# Patient Record
Sex: Female | Born: 1935 | Race: Black or African American | Hispanic: No | Smoking: Never smoker
Health system: Southern US, Academic
[De-identification: ages and names within clinical notes are randomized; demographics above are authoritative.]

## PROBLEM LIST (undated history)

## (undated) DIAGNOSIS — N19 Unspecified kidney failure: Secondary | ICD-10-CM

## (undated) DIAGNOSIS — I10 Essential (primary) hypertension: Secondary | ICD-10-CM

## (undated) DIAGNOSIS — E7800 Pure hypercholesterolemia, unspecified: Secondary | ICD-10-CM

## (undated) DIAGNOSIS — I509 Heart failure, unspecified: Secondary | ICD-10-CM

## (undated) DIAGNOSIS — M129 Arthropathy, unspecified: Secondary | ICD-10-CM

## (undated) DIAGNOSIS — J309 Allergic rhinitis, unspecified: Secondary | ICD-10-CM

## (undated) DIAGNOSIS — E119 Type 2 diabetes mellitus without complications: Secondary | ICD-10-CM

## (undated) HISTORY — DX: Arthropathy, unspecified: M12.9

## (undated) HISTORY — DX: Pure hypercholesterolemia, unspecified: E78.00

## (undated) HISTORY — PX: HX LAP CHOLECYSTECTOMY: SHX56

## (undated) HISTORY — PX: WRIST SURGERY: SHX841

## (undated) HISTORY — PX: LAMINECTOMY: SHX219

## (undated) HISTORY — PX: HX CATARACT REMOVAL: SHX102

## (undated) HISTORY — DX: Allergic rhinitis, unspecified: J30.9

## (undated) HISTORY — PX: HX HYSTERECTOMY: SHX81

## (undated) HISTORY — DX: Essential (primary) hypertension: I10

## (undated) HISTORY — DX: Type 2 diabetes mellitus without complications (CMS HCC): E11.9

## (undated) HISTORY — DX: Unspecified kidney failure: N19

## (undated) HISTORY — PX: HX ELBOW SURGERY: 2100001297

## (undated) HISTORY — PX: BREAST MASS EXCISION: SHX1267

## (undated) HISTORY — DX: Heart failure, unspecified (CMS HCC): I50.9

---

## 1988-07-06 ENCOUNTER — Other Ambulatory Visit (HOSPITAL_COMMUNITY): Payer: Self-pay

## 2014-09-30 IMAGING — US VENOUS US UNILATERAL
1 series · 14 of 24 positions shown · non-contrast
Comparison: None.

Exam: 
Left lower extremity Doppler venous ultrasound
INDICATION: Rule out DVT.

[Series 1: venous us unilateral · oblique · 14 of 59 slices shown]
[im 1/59]
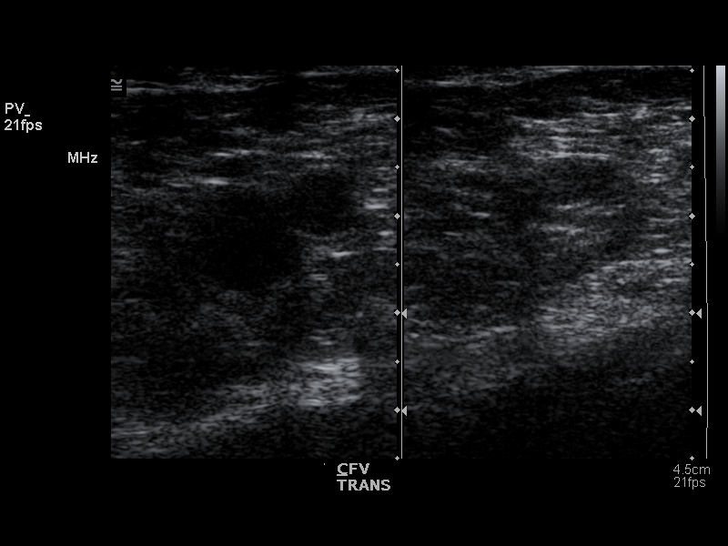
[im 6/59]
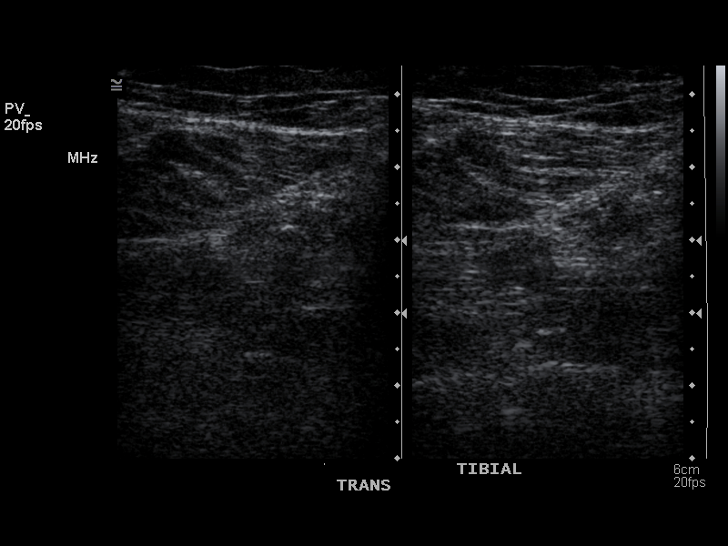
[im 11/59]
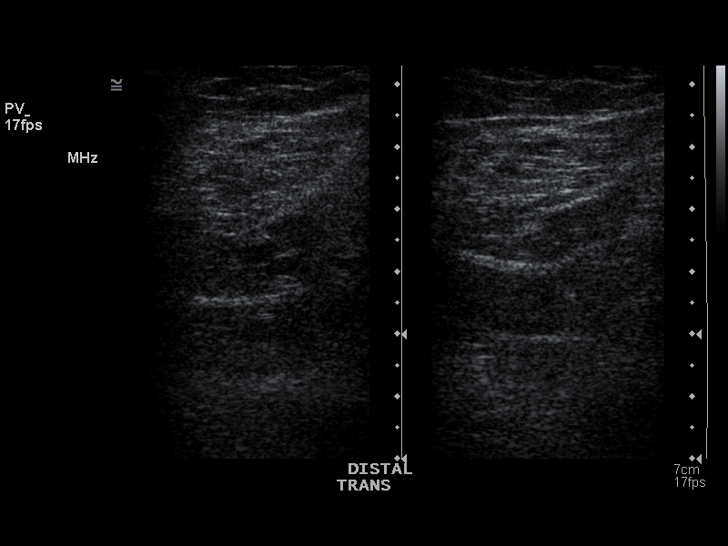
[im 16/59]
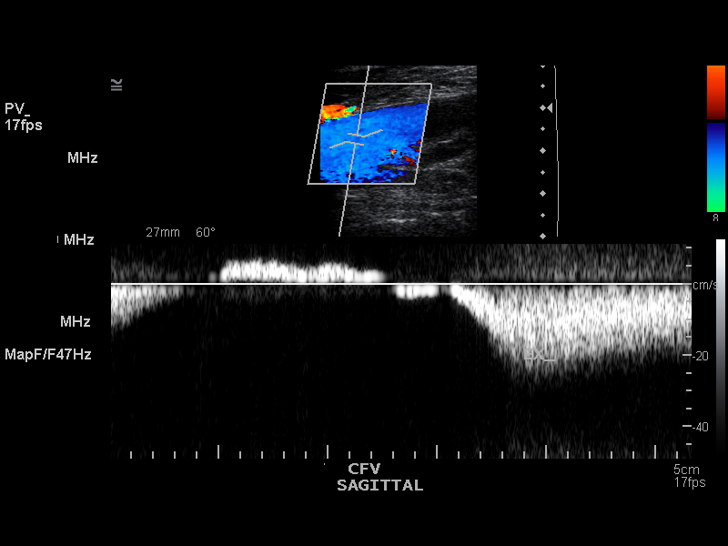
[im 18/59]
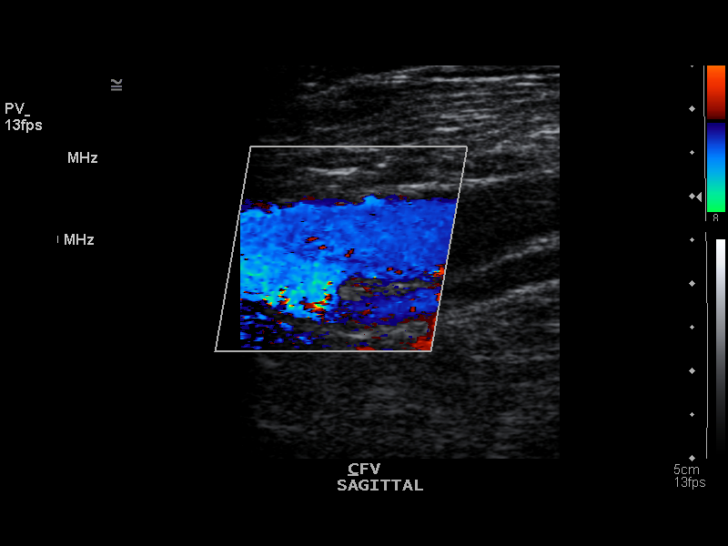
[im 23/59]
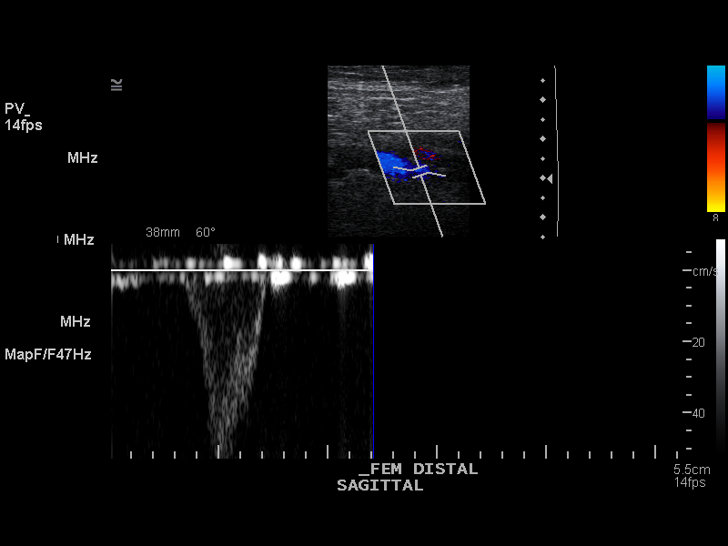
[im 28/59]
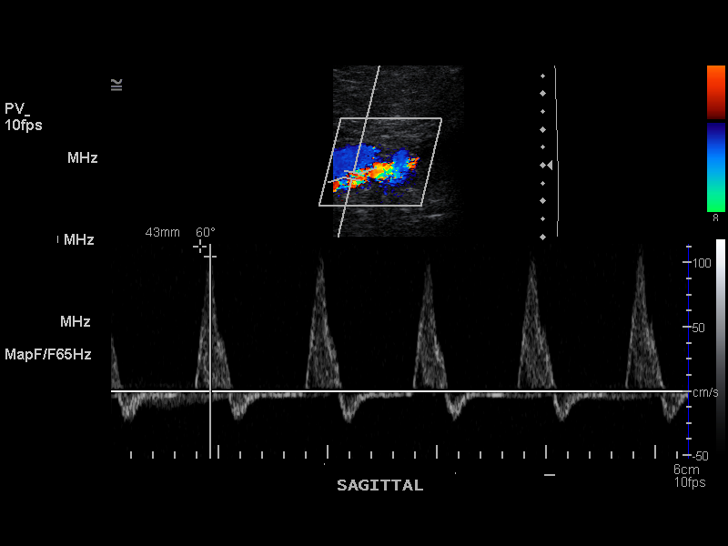
[im 31/59]
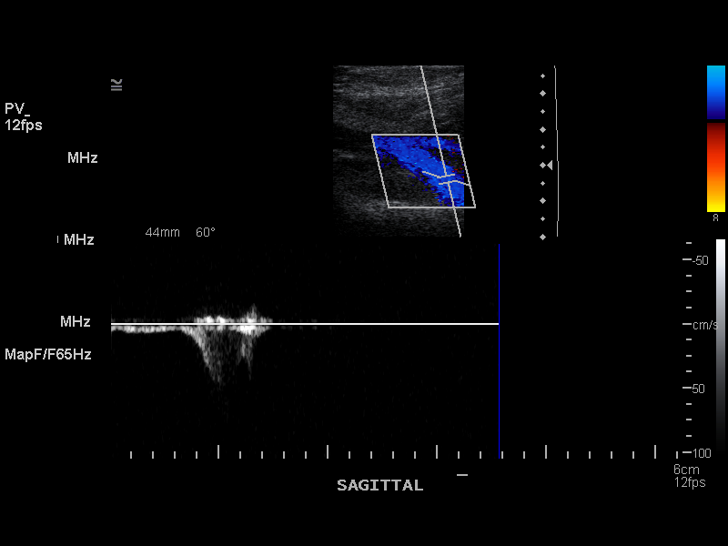
[im 36/59]
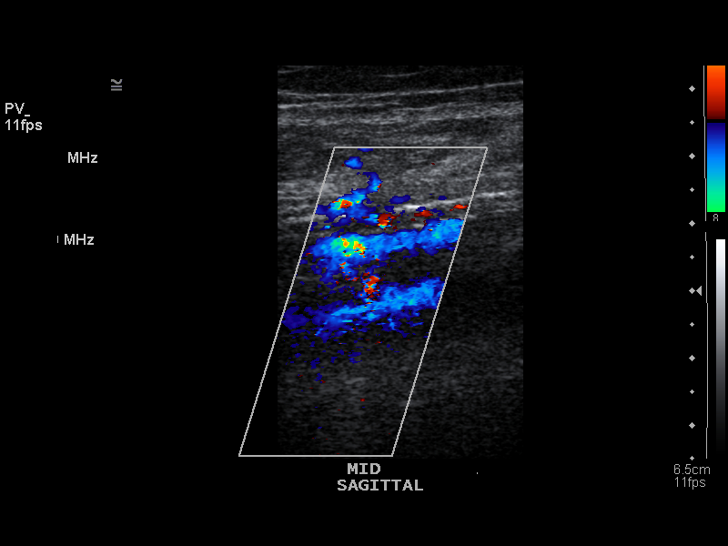
[im 41/59]
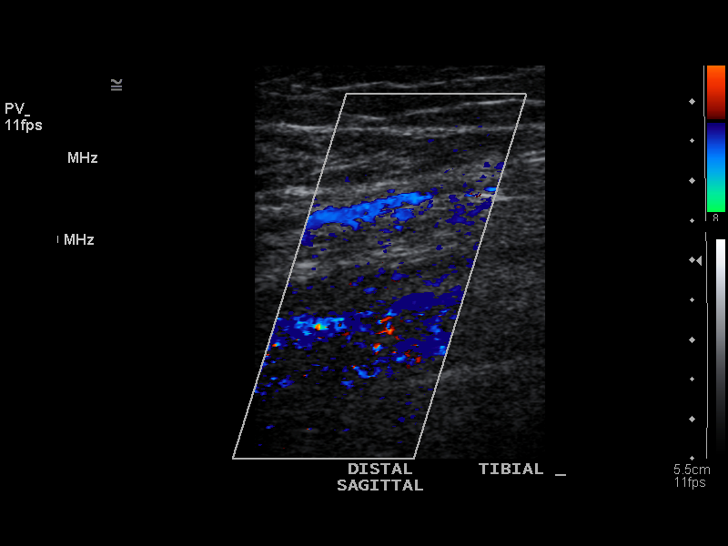
[im 46/59]
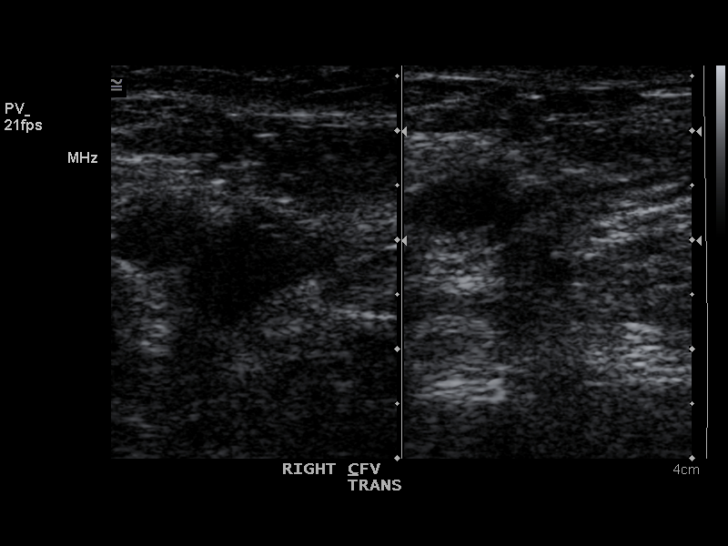
[im 48/59]
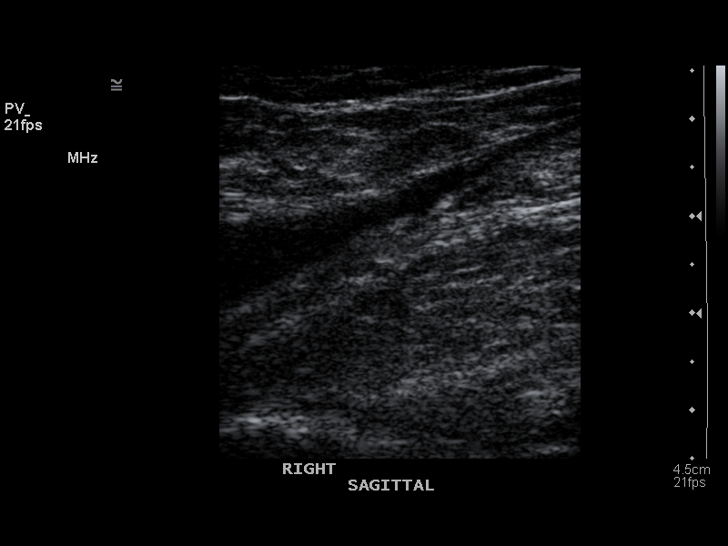
[im 53/59]
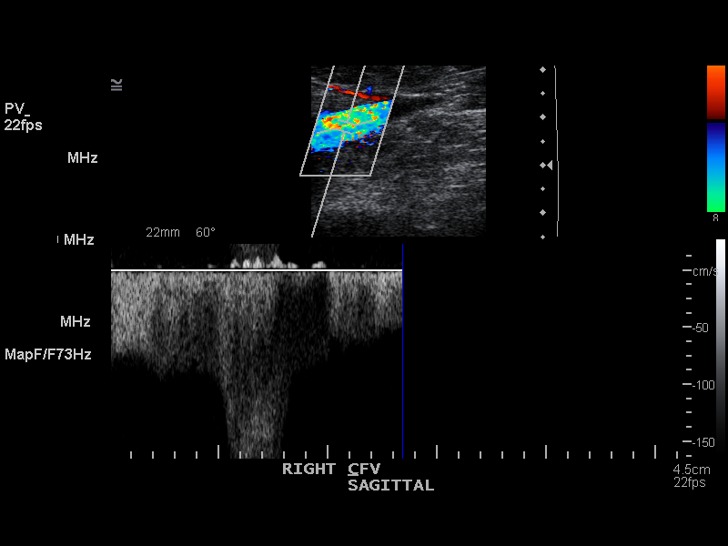
[im 59/59]
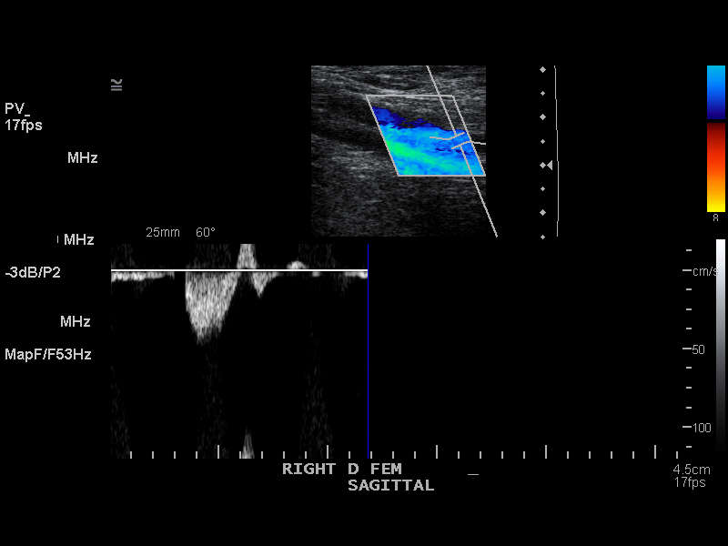

[14 of 24 positions shown; findings below may reference images not displayed]

FINDINGS: The common femoral, superficial femoral, popliteal veins of the left lower extremity demonstrate compressibility, color flow, and augmentation. The contra lateral common femoral vein demonstrates compressibility as well.
IMPRESSION: 1.
Negative left lower extremity Doppler venous ultrasound.

## 2020-06-30 IMAGING — MR MRI BRAIN W/O CONTRAST
8 of 9 series · 36 of 48 positions shown · IV contrast (gadolinium)
Comparison: None available.

﻿EXAM:  MRI BRAIN W/O CONTRAST
INDICATION: Dementia.
TECHNIQUE: Multiplanar, multisequential MRI of the brain was performed without gadolinium contrast.

[Series 5: DWI · axial · 5.0mm · 1.35mm/px · z∈[-53,+67]mm · 10 of 88 slices shown (1 of 3)]
[im 6/88]
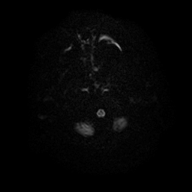
[im 12/88]
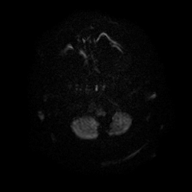
[im 18/88]
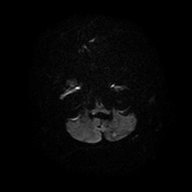
[im 30/88]
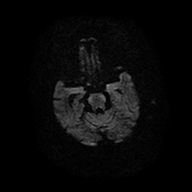
[im 41/88]
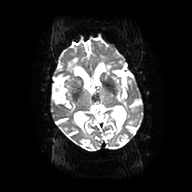
[im 47/88]
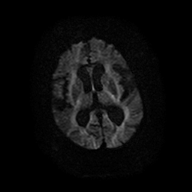
[im 53/88]
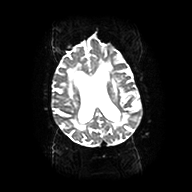
[im 64/88]
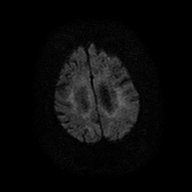
[im 76/88]
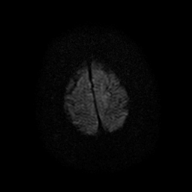
[im 88/88]
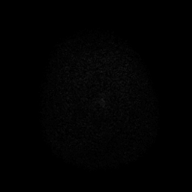

[Series 6: DWI · axial · 5.0mm · 1.35mm/px · z∈[-59,+67]mm · 4 of 22 slices shown (2 of 3)]
[im 1/22]
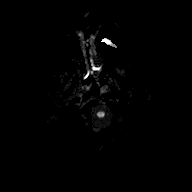
[im 8/22]
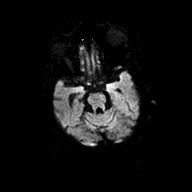
[im 15/22]
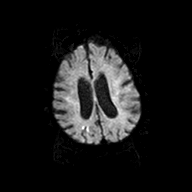
[im 22/22]
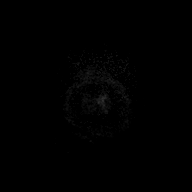

[Series 7: DWI · axial · 5.0mm · 1.35mm/px · z∈[-59,+67]mm · 4 of 22 slices shown (3 of 3)]
[im 1/22]
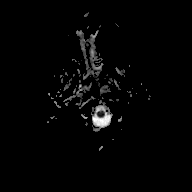
[im 8/22]
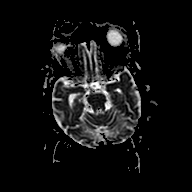
[im 15/22]
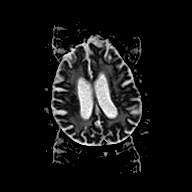
[im 22/22]
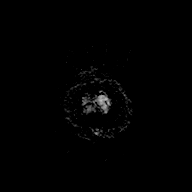

[Series 8: FLAIR · sagittal · 4.0mm · 0.75mm/px · 4 of 26 slices shown (1 of 2)]
[im 1/26]
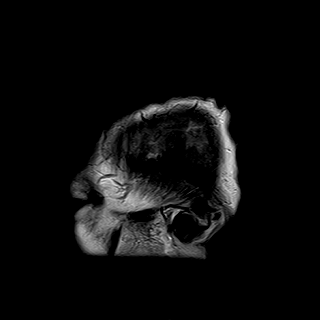
[im 9/26]
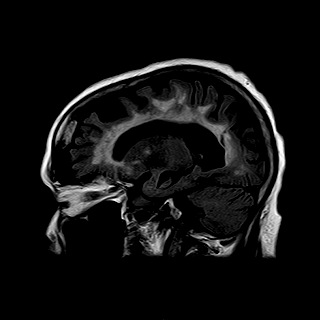
[im 17/26]
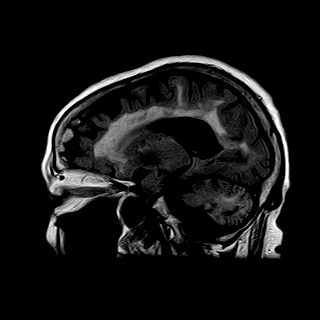
[im 26/26]
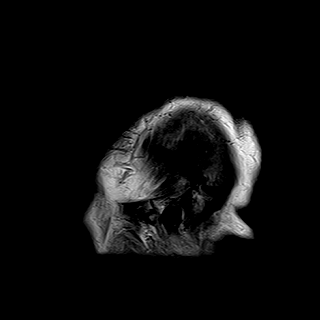

[Series 9: FLAIR · axial · 5.0mm · 0.43mm/px · z∈[-63,+81]mm · 4 of 25 slices shown (2 of 2)]
[im 1/25]
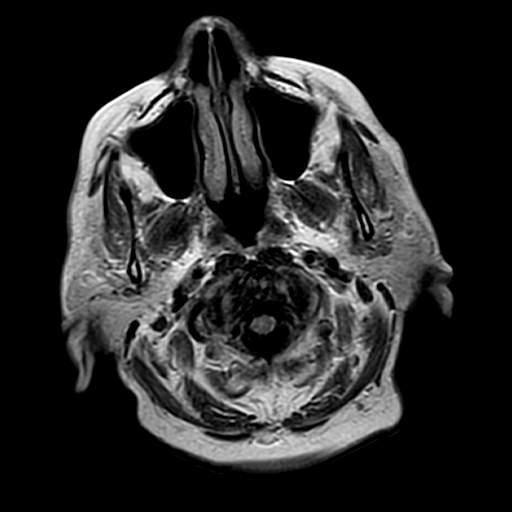
[im 9/25]
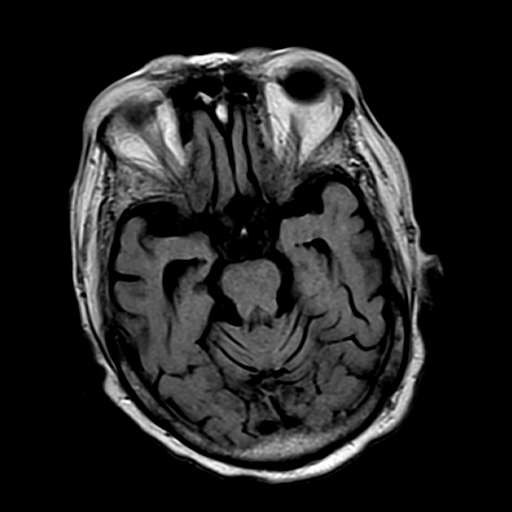
[im 17/25]
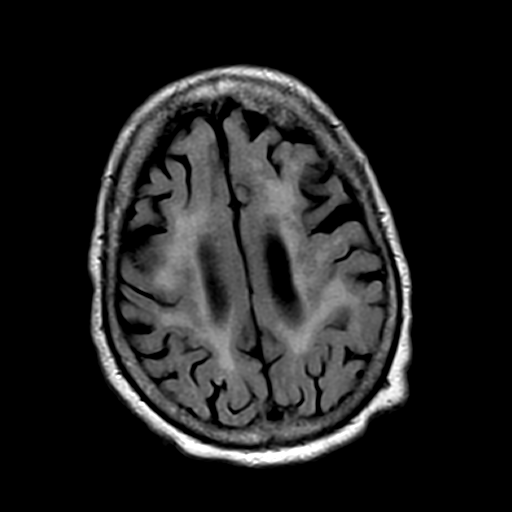
[im 25/25]
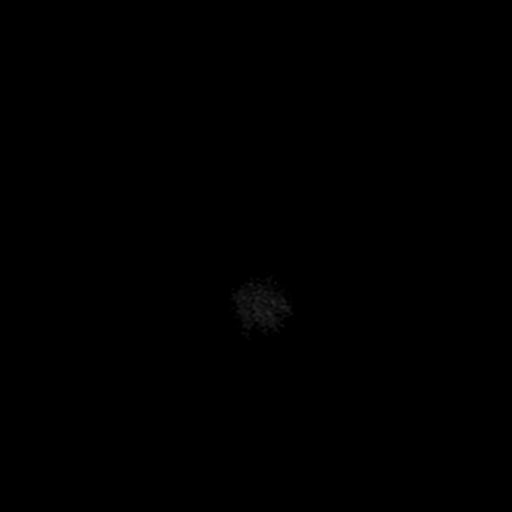

[Series 10: T2 · axial · 5.0mm · 0.43mm/px · z∈[-63,+81]mm · 4 of 25 slices shown (1 of 2)]
[im 1/25]
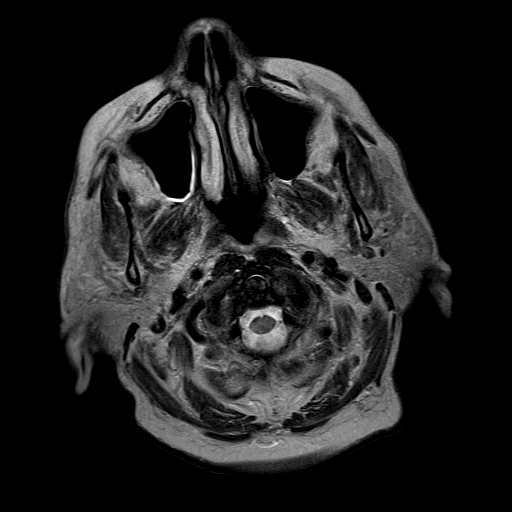
[im 9/25]
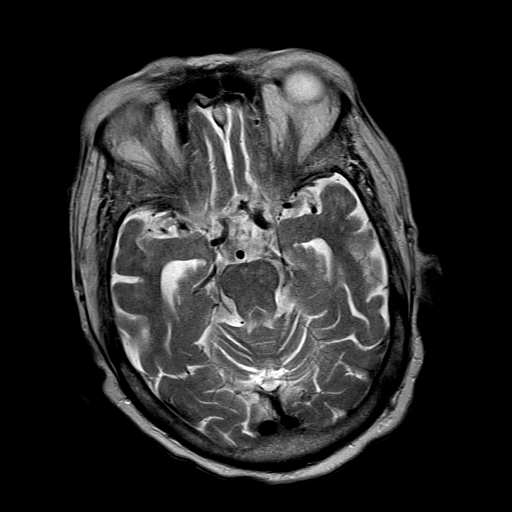
[im 17/25]
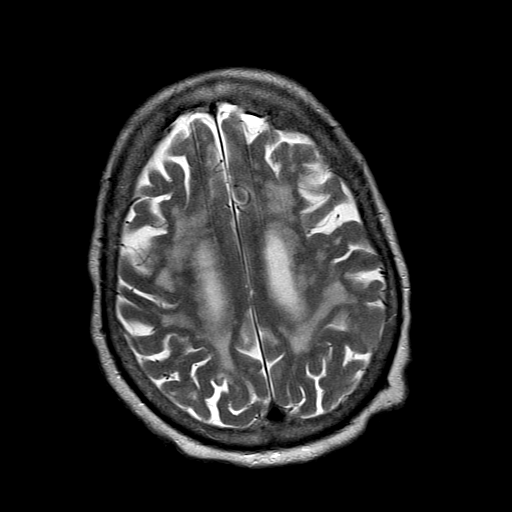
[im 25/25]
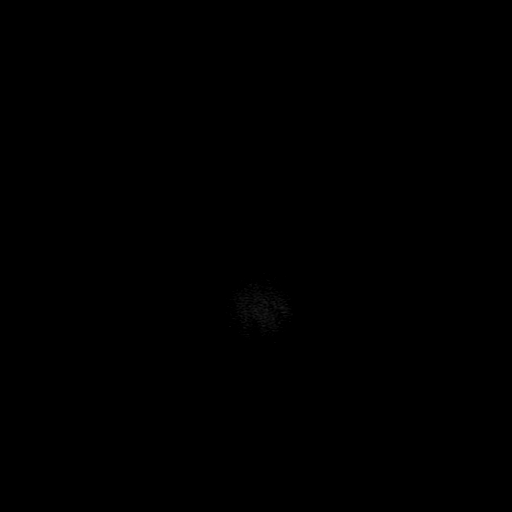

[Series 11: T2 · coronal · 6.0mm · 0.43mm/px · 4 of 24 slices shown (2 of 2)]
[im 1/24]
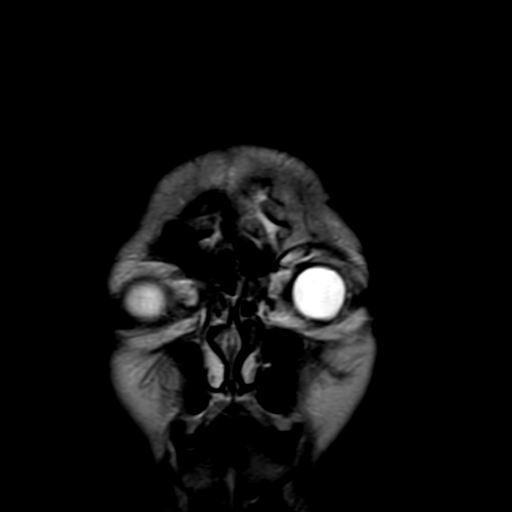
[im 8/24]
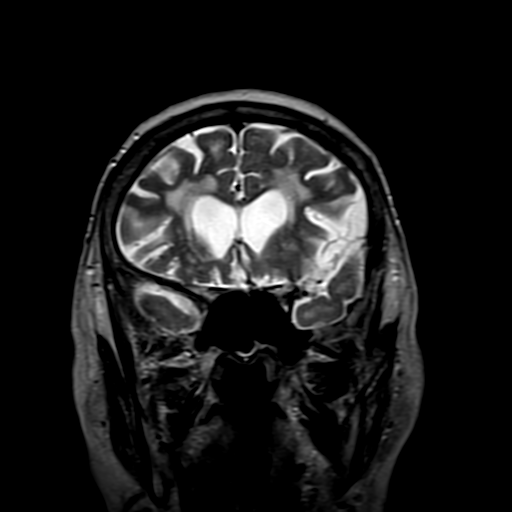
[im 16/24]
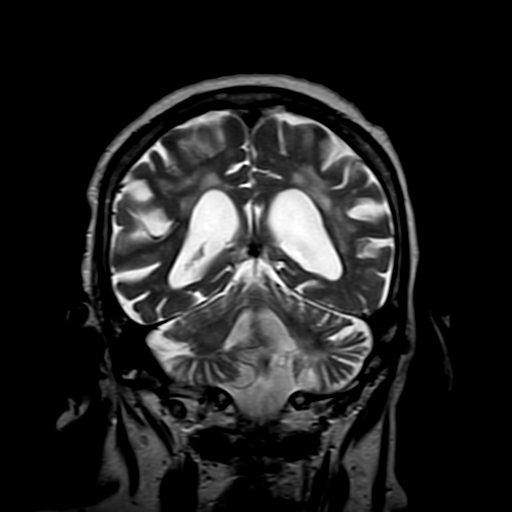
[im 24/24]
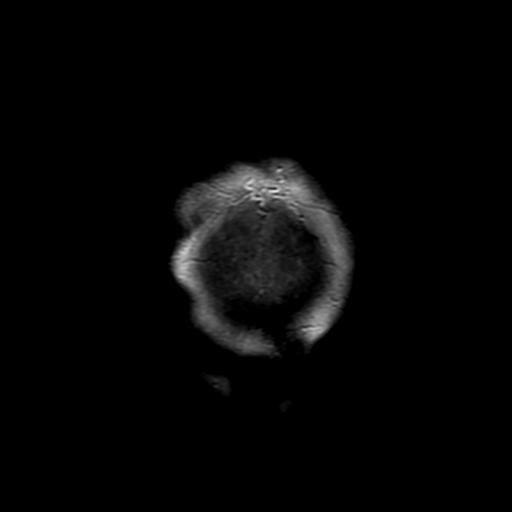

[Series 13: T1 · axial · 5.0mm · 0.43mm/px · z∈[-63,-15]mm · 2 of 25 slices shown]
[im 1/25]
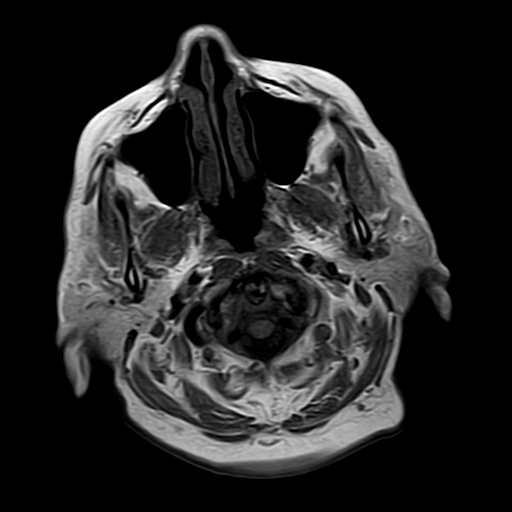
[im 9/25]
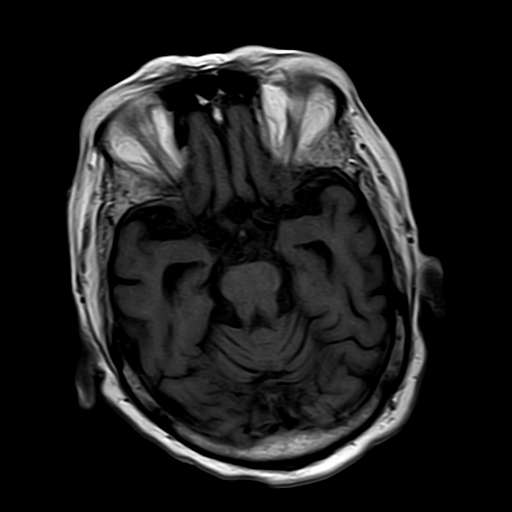

[36 of 48 positions shown; findings below may reference images not displayed]

FINDINGS: There is moderate cortical atrophy and severe chronic small vessel ischemic changes. There is no evidence of normal pressure hydrocephalus. Left inferior cerebellar area of encephalomalacia is also identified. There is no mass effect, midline shift or intracranial hemorrhage. There is no evidence of acute infarction or prior microhemorrhages. Skull base flow voids and basal cisterns are patent. Sagittal survey of midline structures is unremarkable. There are no extra-axial fluid collections. There is partial opacification of the mastoid air cells bilaterally. Partial opacification of right ethmoid air cells is also noted. Orbital contents appear unremarkable.
IMPRESSION: Moderate cortical atrophy and severe chronic small vessel ischemic changes. No acute intracranial abnormality.

## 2020-07-09 IMAGING — MR MRI CERVICAL SPINE WITHOUT CONTRAST
4 of 5 series · 23 of 48 positions shown · IV contrast (gadolinium)
Comparison: None available.

﻿EXAM:  [id number]   MRI CERVICAL SPINE WITHOUT CONTRAST
INDICATION: Bilateral lower extremity paralysis.
TECHNIQUE: Multiplanar, multisequential MRI of the cervical spine was performed without gadolinium contrast.

[Series 5: T2 · sagittal · 3.0mm · 0.78mm/px · 7 of 11 slices shown (1 of 2)]
[im 1/11]
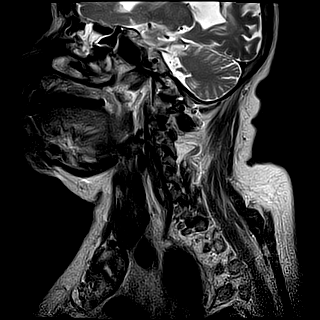
[im 2/11]
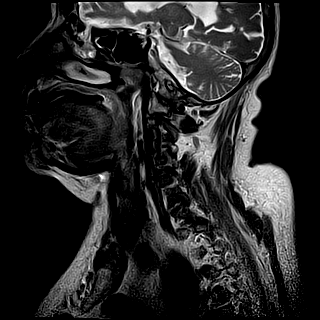
[im 4/11]
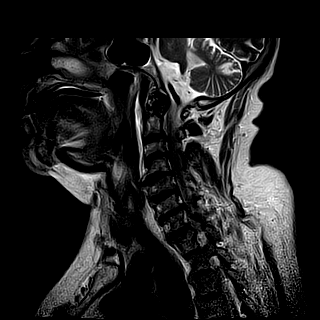
[im 6/11]
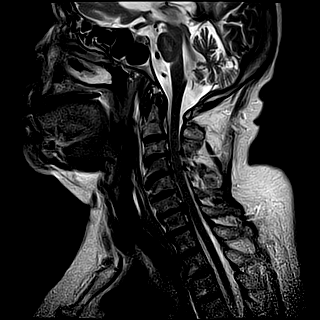
[im 7/11]
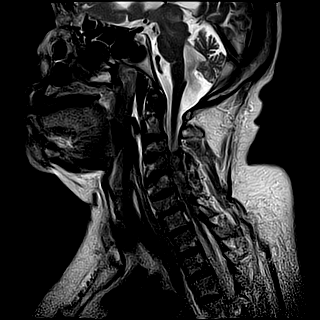
[im 9/11]
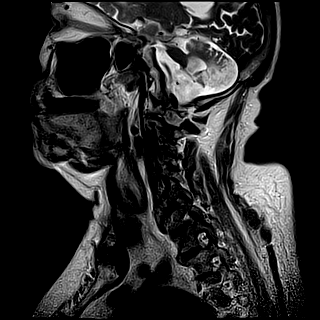
[im 11/11]
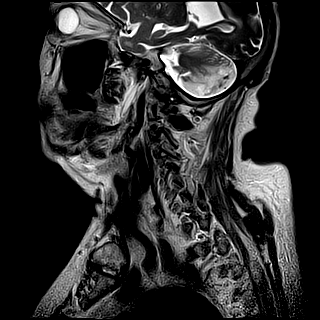

[Series 6: T1 · sagittal · 3.0mm · 0.49mm/px · 4 of 11 slices shown]
[im 1/11]
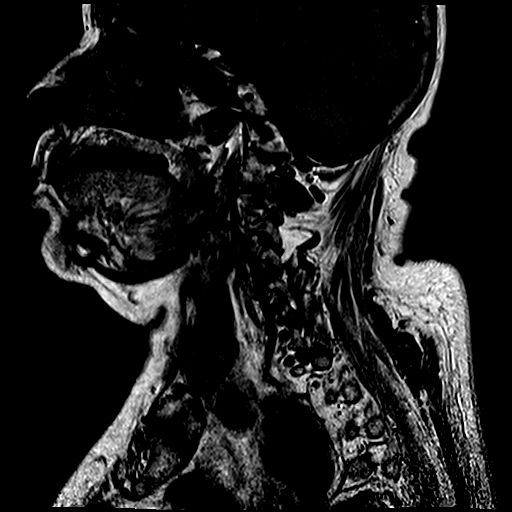
[im 2/11]
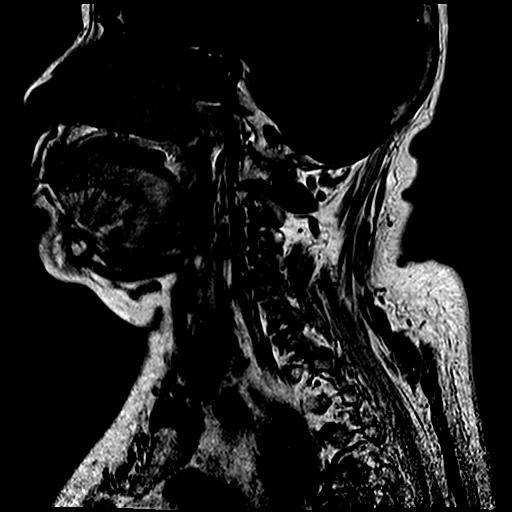
[im 6/11]
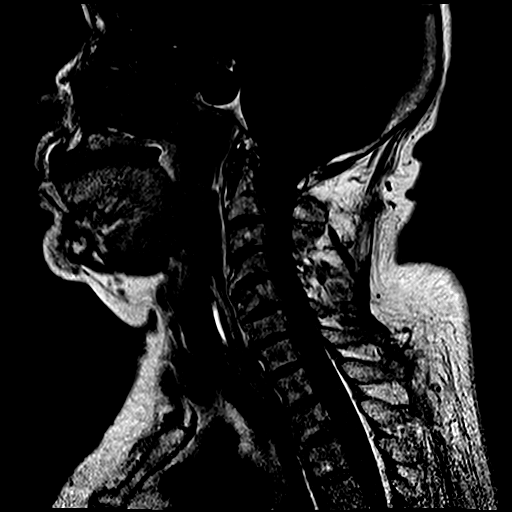
[im 9/11]
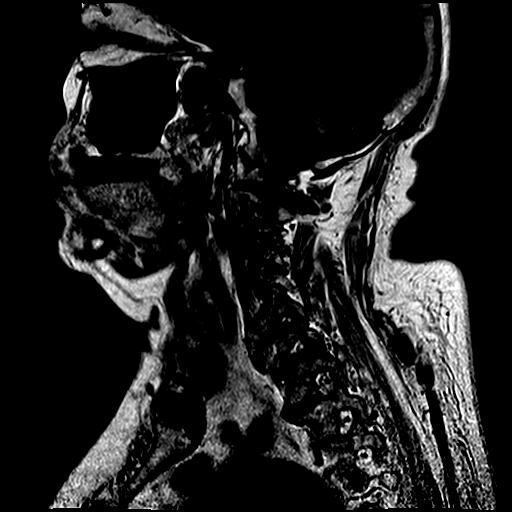

[Series 7: STIR · sagittal · 3.0mm · 0.49mm/px · 3 of 11 slices shown]
[im 2/11]
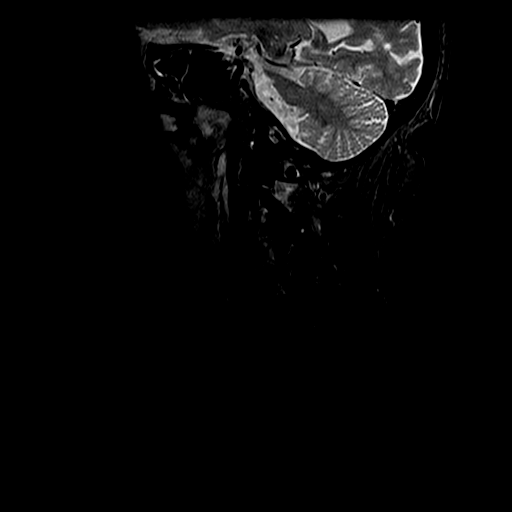
[im 6/11]
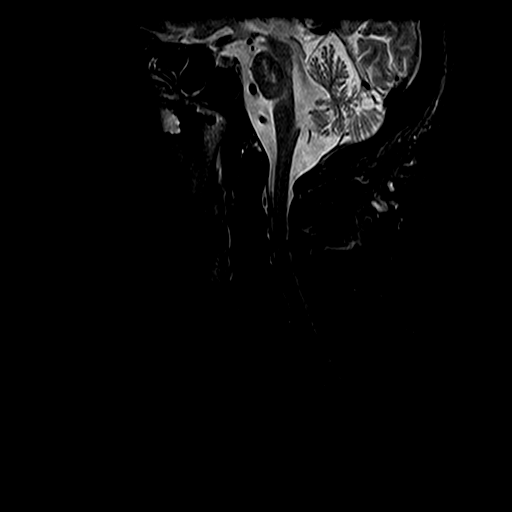
[im 9/11]
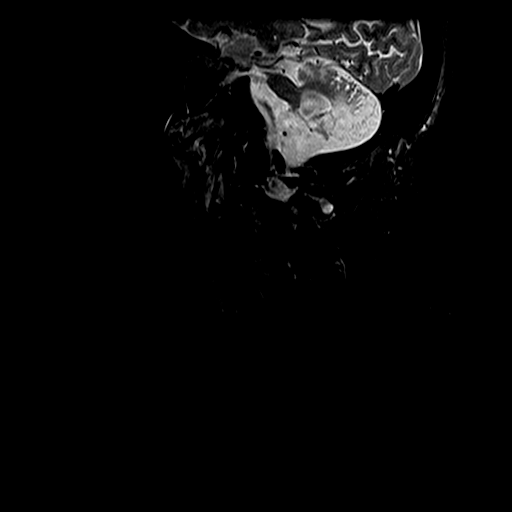

[Series 9: T2 · axial · 3.5mm · 0.31mm/px · z∈[-83,+5]mm · 9 of 18 slices shown (2 of 2)]
[im 1/18]
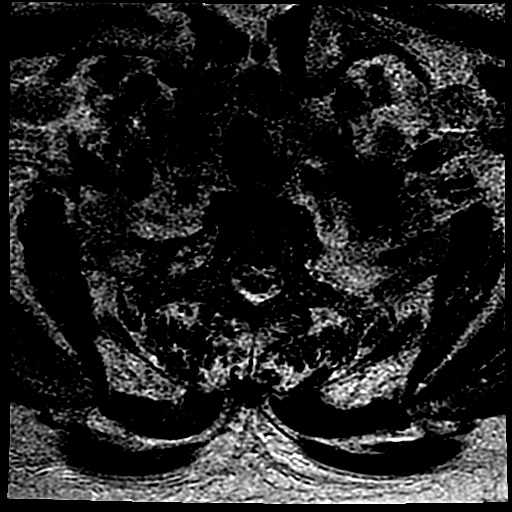
[im 3/18]
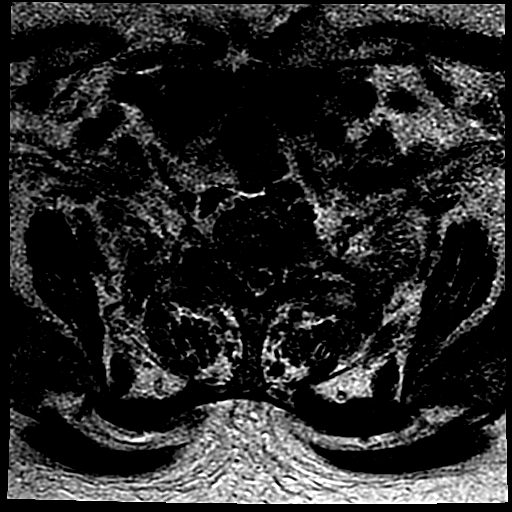
[im 6/18]
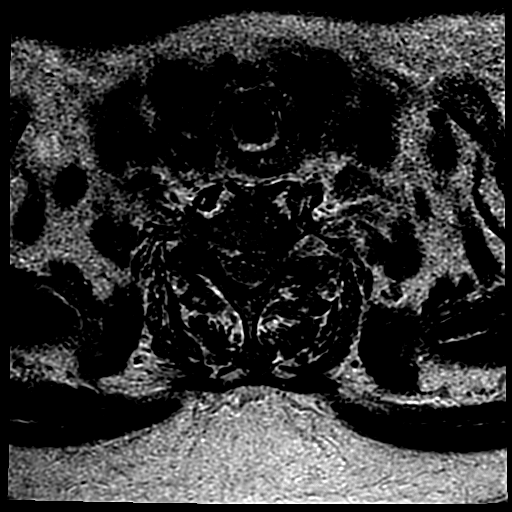
[im 8/18]
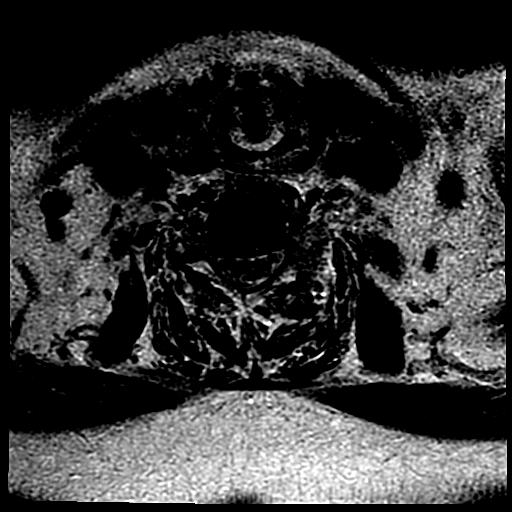
[im 9/18]
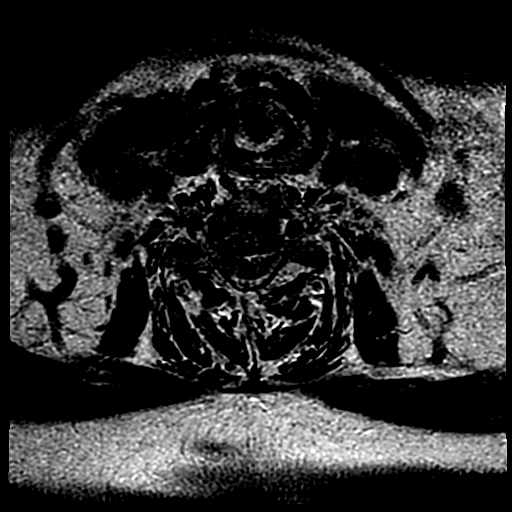
[im 10/18]
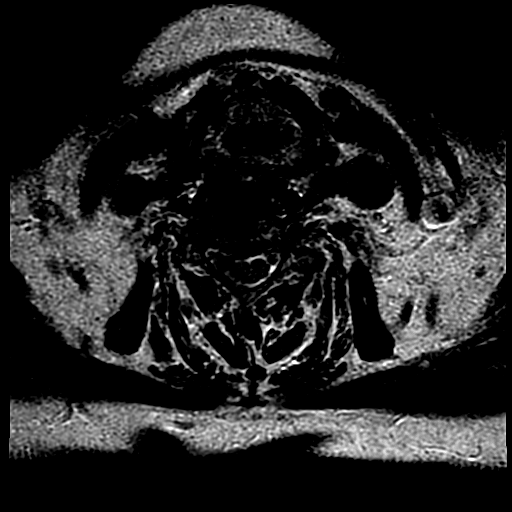
[im 12/18]
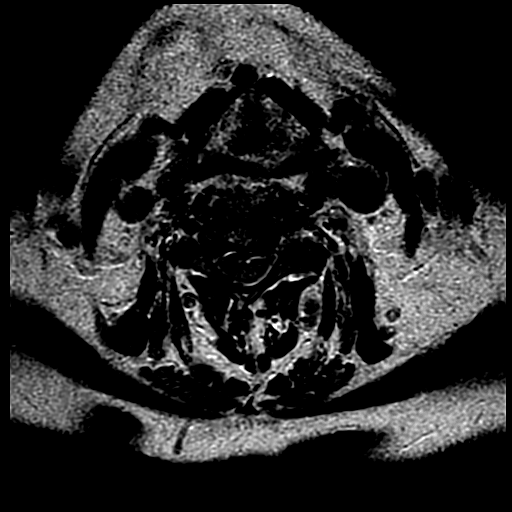
[im 15/18]
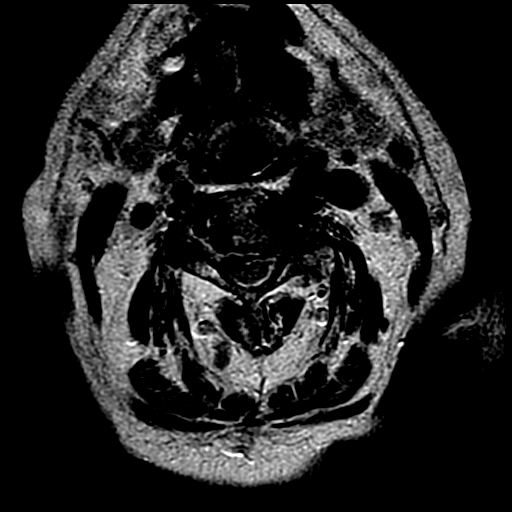
[im 18/18]
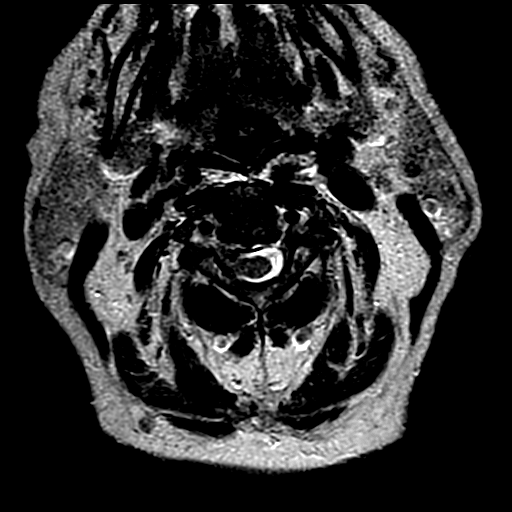

[23 of 48 positions shown; findings below may reference images not displayed]

FINDINGS: Vertebral bodies are normal in height, alignment and signal intensity.  There is no acute fracture or subluxation.  

At C2-3 level, there is moderate left neural foraminal stenosis from facet and uncovertebral joint hypertrophy.  

At C3-4 level, there is a small broad-based central disc osteophyte complex resulting in mild spinal stenosis.  There is severe left and mild right neural foraminal stenosis from facet and uncovertebral joint hypertrophy.  

At C4-5 level, there is a small broad-based central disc osteophyte complex with near complete effacement of the ventral CSF.  There is severe right neural foraminal stenosis from facet and uncovertebral joint hypertrophy.  

At C5-6 level, there is a small broad-based central disc osteophyte complex with near complete effacement of the ventral CSF.  There is mild right neural foraminal stenosis from facet and uncovertebral joint hypertrophy.

At C6-7 level, there is a small broad-based central disc osteophyte complex with near complete effacement of the ventral CSF.  There is severe right neural foraminal stenosis from uncovertebral joint hypertrophy.  

At C7-T1 level, there is minimal anterolisthesis of C7 on T1 vertebral body.  There is a small broad-based central disc osteophyte complex partially effacing the ventral CSF.  There is moderate left neural foraminal stenosis from facet arthropathy.  

Paraspinal soft tissues are unremarkable.
IMPRESSION: 1. Minimal anterolisthesis of C7 on T1 vertebral body.  

2. Disc osteophyte complexes at most levels with mild spinal stenosis at C3-4 level.  

3. Multi-level neural foraminal stenosis as detailed above.

## 2020-07-09 IMAGING — MR MRI THORACIC SPINE WITHOUT CONTRAST
4 of 6 series · 30 of 48 positions shown · IV contrast (gadolinium)
Comparison: None available.

﻿EXAM:  [id number]   MRI THORACIC SPINE WITHOUT CONTRAST
INDICATION: Lower extremity paralysis.
TECHNIQUE: Multiplanar, multisequential MRI of the thoracic spine was performed without gadolinium contrast.

[Series 5: T2 · sagittal · 4.0mm · 0.78mm/px · 7 of 13 slices shown (1 of 3)]
[im 1/13]
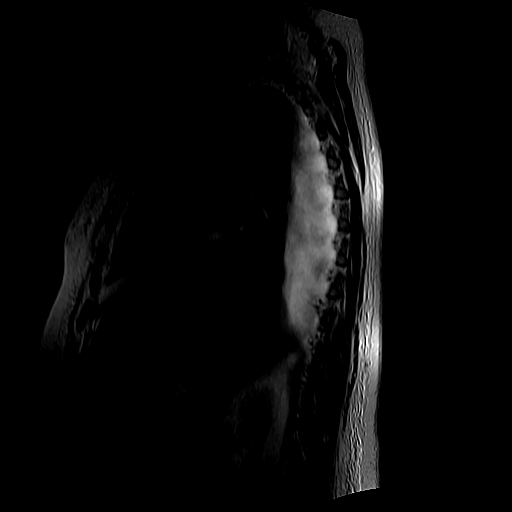
[im 3/13]
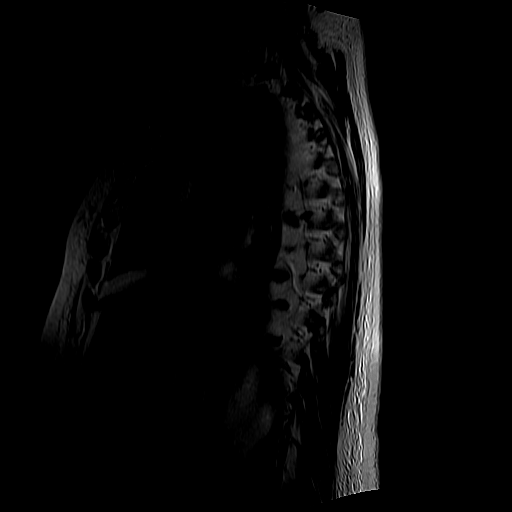
[im 5/13]
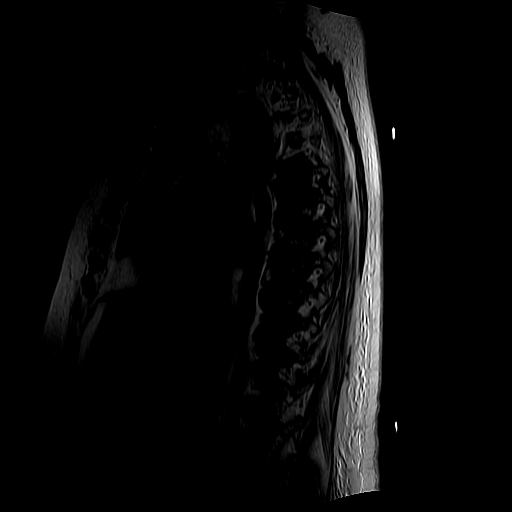
[im 7/13]
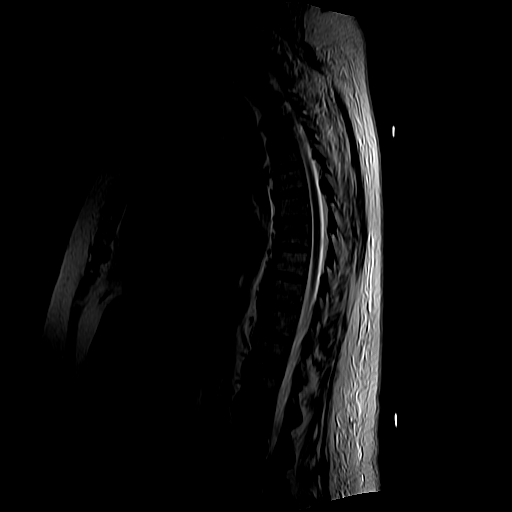
[im 9/13]
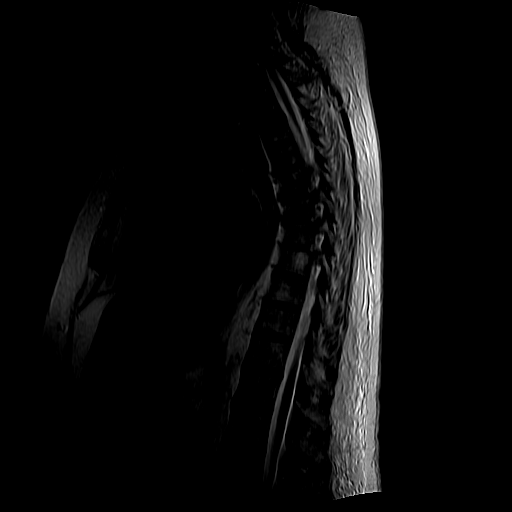
[im 11/13]
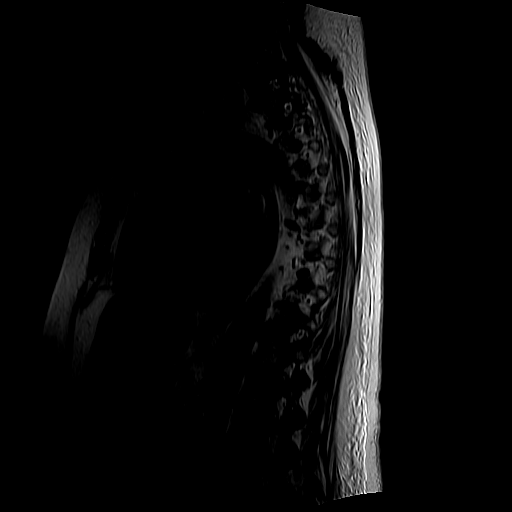
[im 13/13]
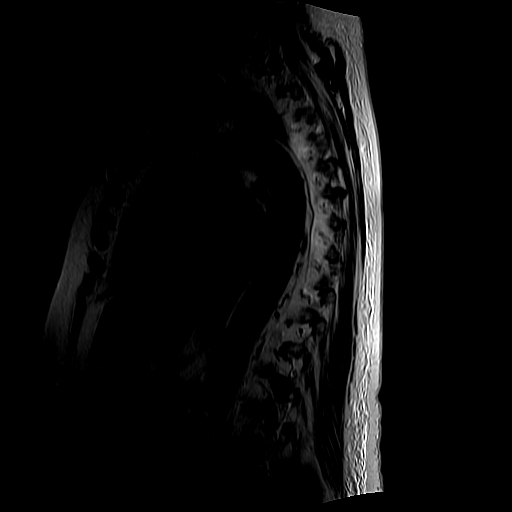

[Series 6: T1 · sagittal · 4.0mm · 0.78mm/px · 5 of 13 slices shown]
[im 1/13]
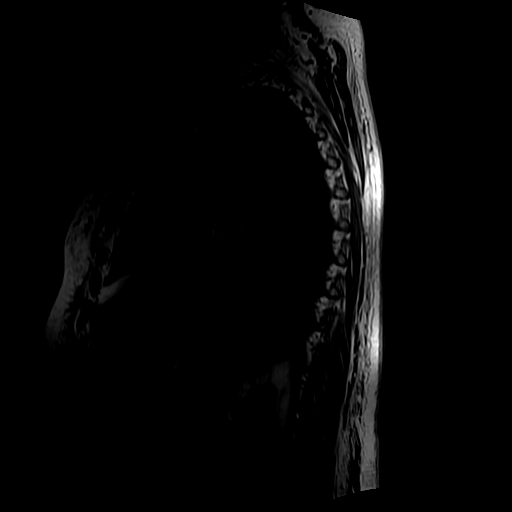
[im 2/13]
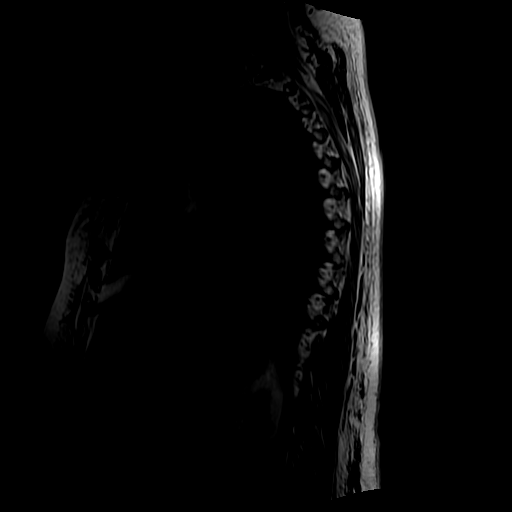
[im 4/13]
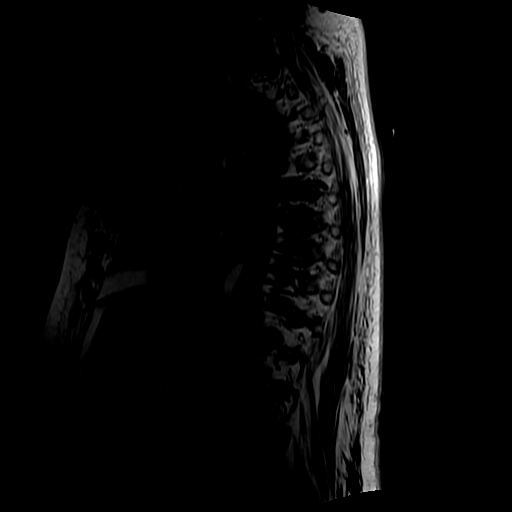
[im 7/13]
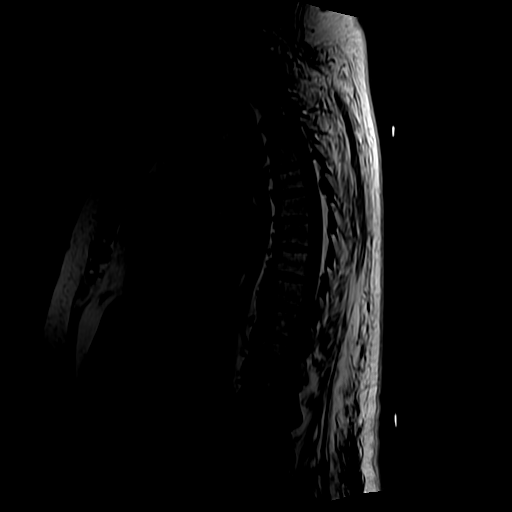
[im 11/13]
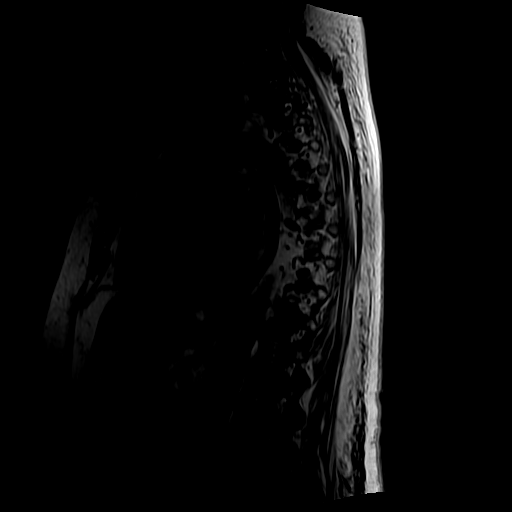

[Series 9: T2 · axial · 4.0mm · 0.62mm/px · z∈[-306,-99]mm · 7 of 12 slices shown (2 of 3)]
[im 1/12]
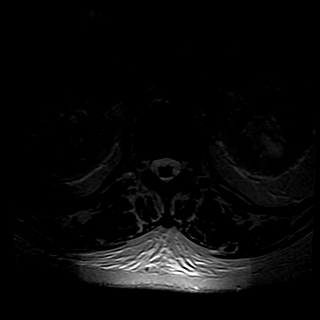
[im 2/12]
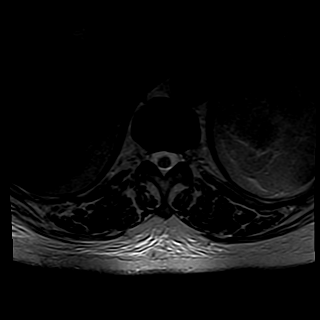
[im 4/12]
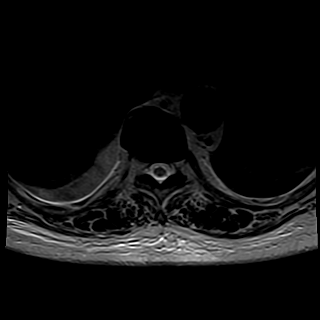
[im 6/12]
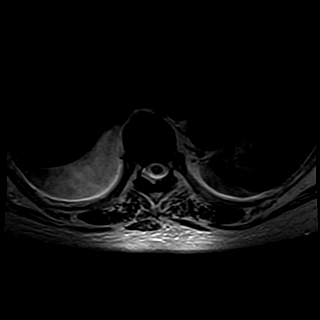
[im 8/12]
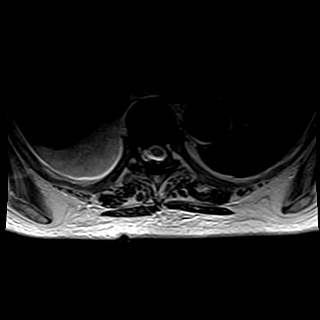
[im 10/12]
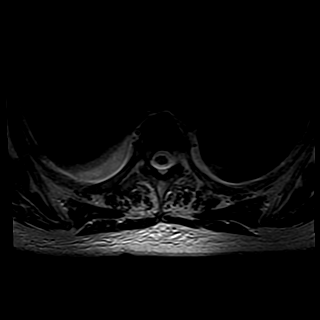
[im 12/12]
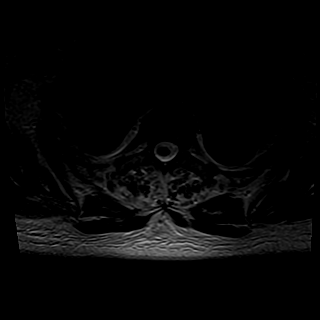

[Series 11: T2 · axial · 18.0mm · 0.69mm/px · z∈[-357,-34]mm · 11 of 18 slices shown (3 of 3)]
[im 1/18]
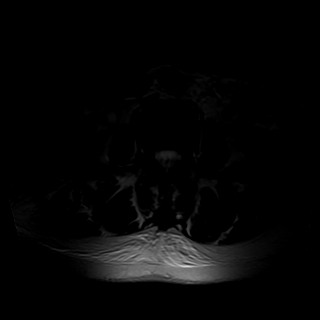
[im 2/18]
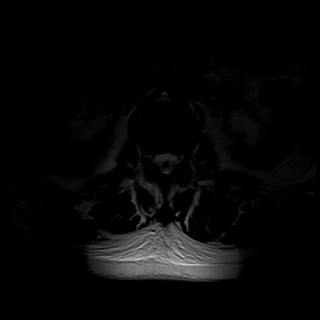
[im 4/18]
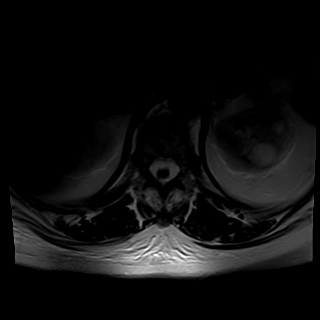
[im 6/18]
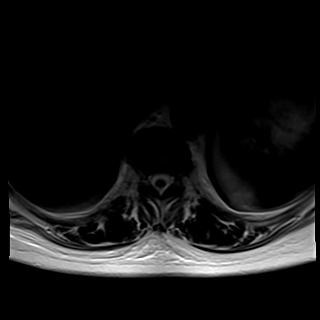
[im 7/18]
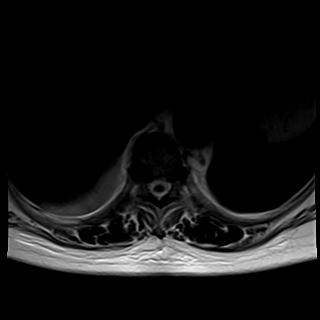
[im 9/18]
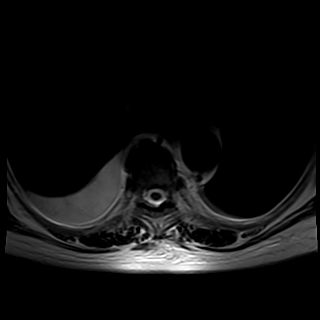
[im 11/18]
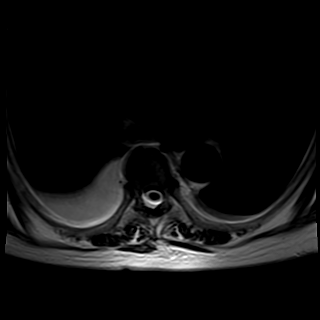
[im 12/18]
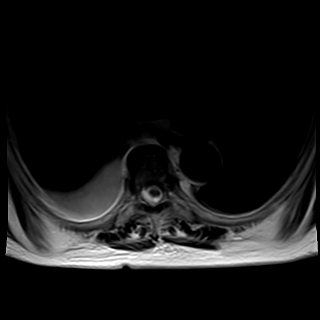
[im 14/18]
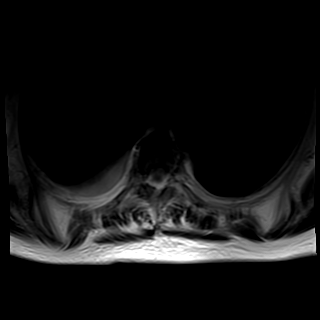
[im 16/18]
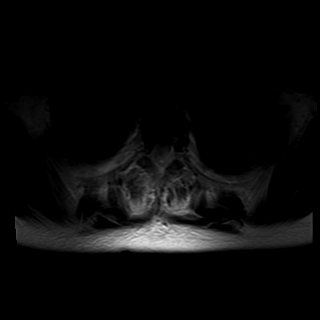
[im 18/18]
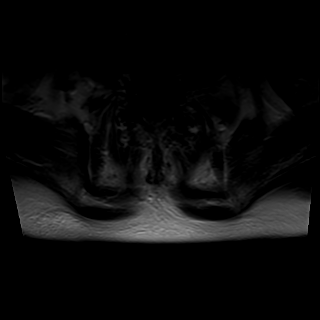

[30 of 48 positions shown; findings below may reference images not displayed]

FINDINGS: Vertebral bodies are normal in height, alignment and signal intensity.  There is no acute fracture or subluxation.  Visualized spinal cord is also normal in signal intensity without evidence of compression at any level.  

There is no significant disc herniation, spinal canal or neural foraminal stenosis at any level.  

Paraspinal soft tissues are also unremarkable.  There is a small right pleural effusion.
IMPRESSION: 1. Unremarkable thoracic spine.  

2. Small right pleural effusion.

## 2020-07-09 IMAGING — MR MRI LUMBAR SPINE WITHOUT CONTRAST
6 series · 43 of 48 positions shown · IV contrast (gadolinium)
Comparison: None available.

﻿EXAM:  [id number]   MRI LUMBAR SPINE WITHOUT CONTRAST
INDICATION: Bilateral lower extremity paralysis.
TECHNIQUE: Multiplanar, multisequential MRI of the lumbar spine was performed without gadolinium contrast.

[Series 8: T2 · sagittal · 4.0mm · 0.94mm/px · 5 of 13 slices shown (1 of 3)]
[im 1/13]
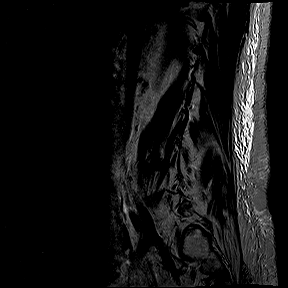
[im 4/13]
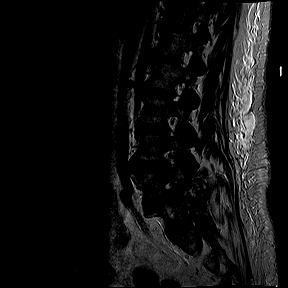
[im 7/13]
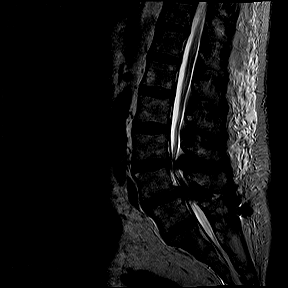
[im 10/13]
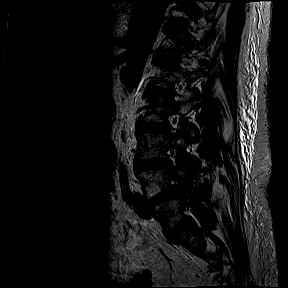
[im 13/13]
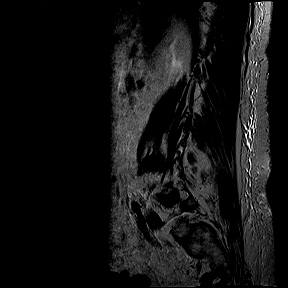

[Series 10: T1 · sagittal · 4.0mm · 0.94mm/px · 6 of 13 slices shown (1 of 2)]
[im 1/13]
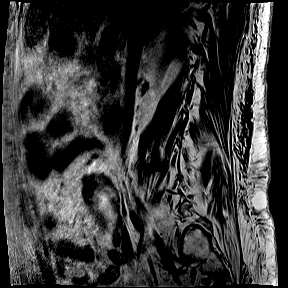
[im 3/13]
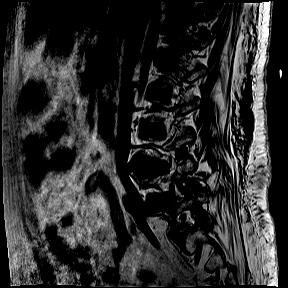
[im 5/13]
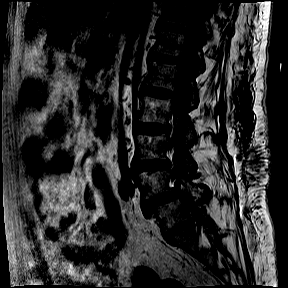
[im 8/13]
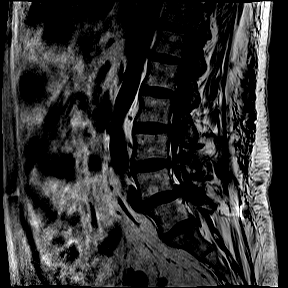
[im 10/13]
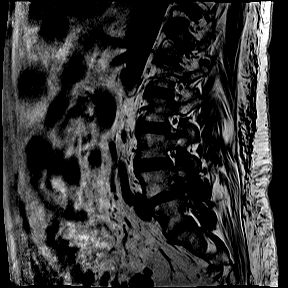
[im 13/13]
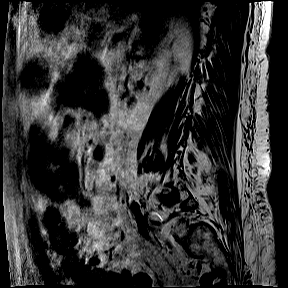

[Series 11: STIR · sagittal · 4.0mm · 1.05mm/px · 4 of 13 slices shown]
[im 1/13]
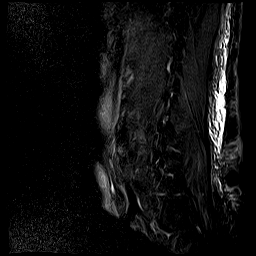
[im 3/13]
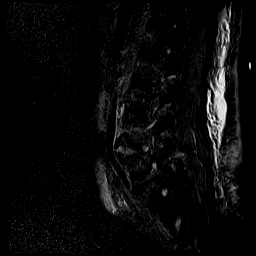
[im 5/13]
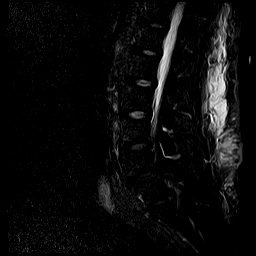
[im 8/13]
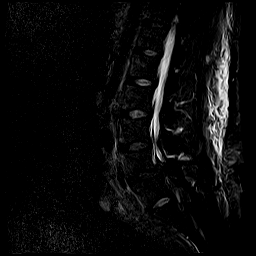

[Series 12: T2 · axial · 4.0mm · 0.47mm/px · z∈[-119,+57]mm · 11 of 23 slices shown (2 of 3)]
[im 1/23]
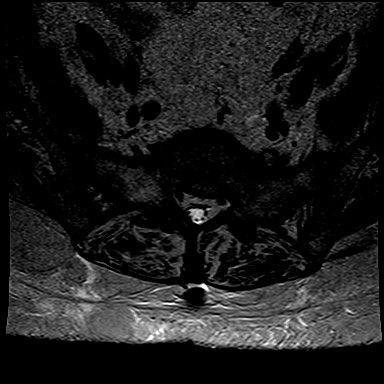
[im 3/23]
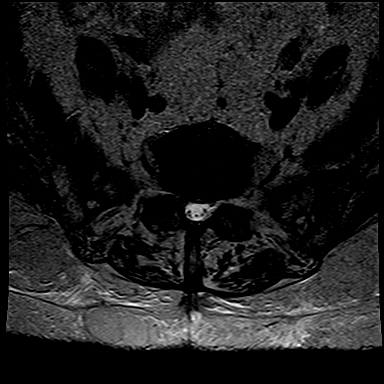
[im 5/23]
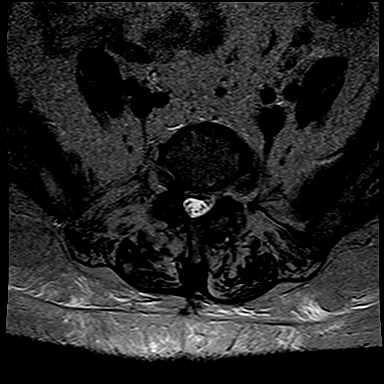
[im 7/23]
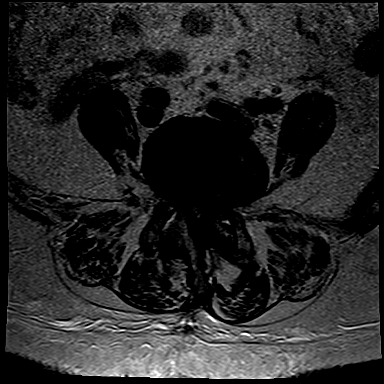
[im 9/23]
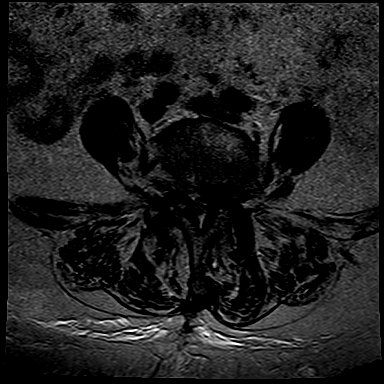
[im 12/23]
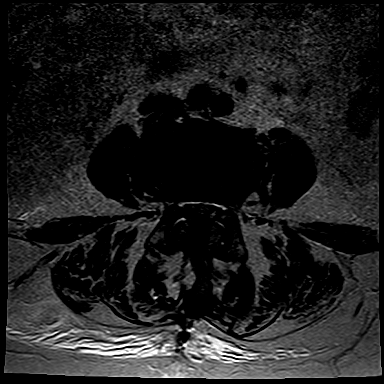
[im 14/23]
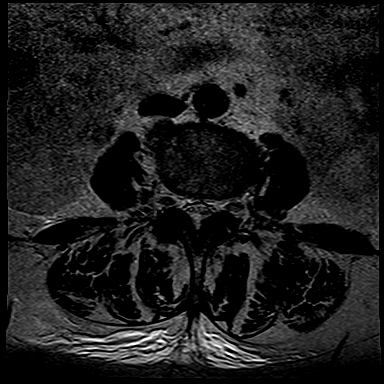
[im 16/23]
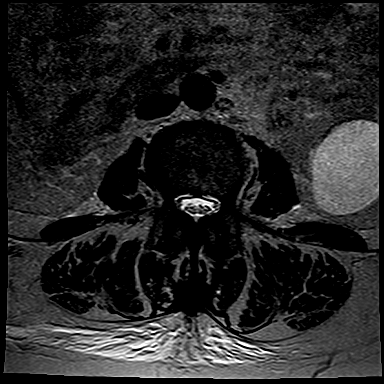
[im 18/23]
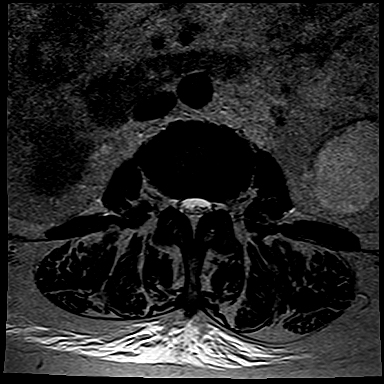
[im 20/23]
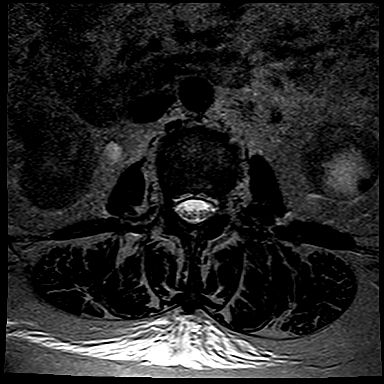
[im 23/23]
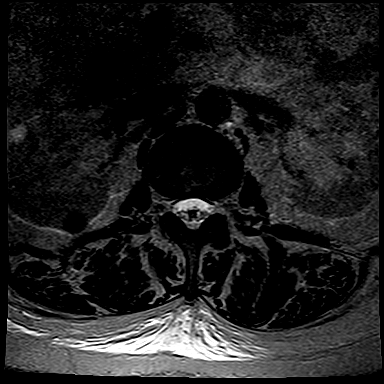

[Series 13: T1 · axial · 4.0mm · 0.47mm/px · z∈[-119,+57]mm · 8 of 23 slices shown (2 of 2)]
[im 1/23]
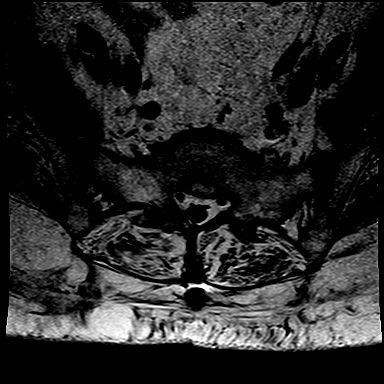
[im 5/23]
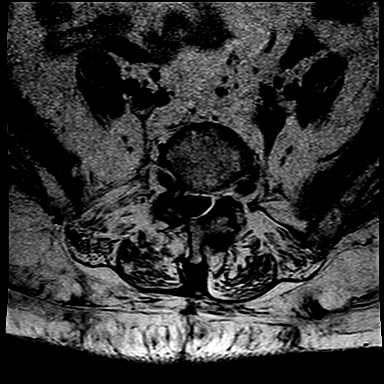
[im 7/23]
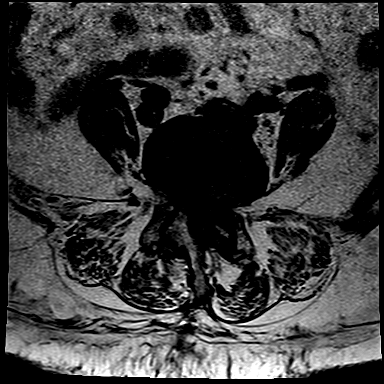
[im 9/23]
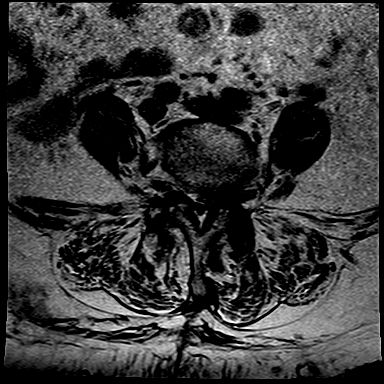
[im 14/23]
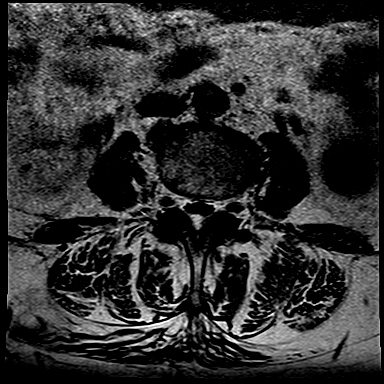
[im 16/23]
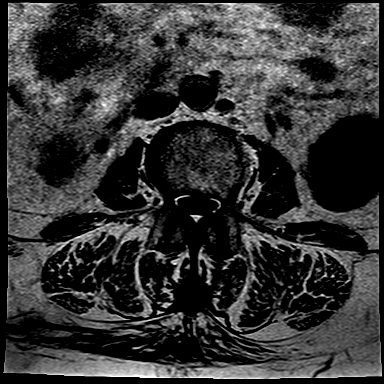
[im 18/23]
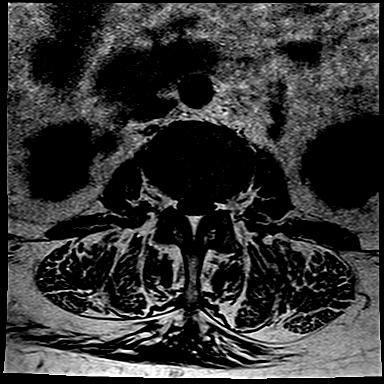
[im 23/23]
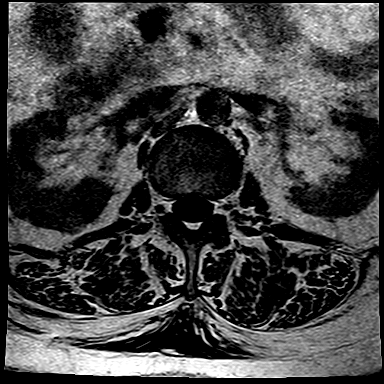

[Series 14: T2 · coronal · 6.0mm · 1.15mm/px · 9 of 20 slices shown (3 of 3)]
[im 1/20]
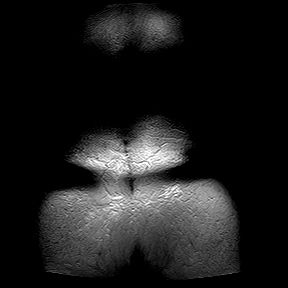
[im 3/20]
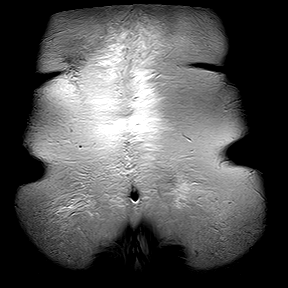
[im 5/20]
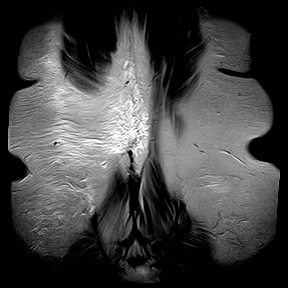
[im 8/20]
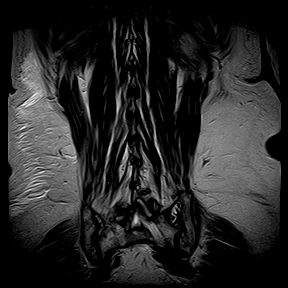
[im 10/20]
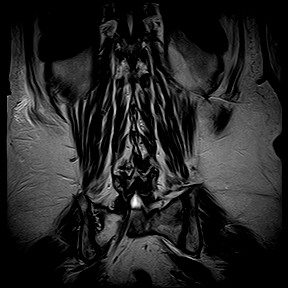
[im 12/20]
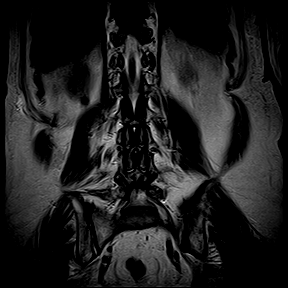
[im 15/20]
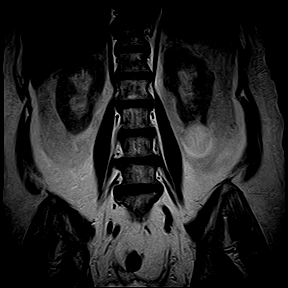
[im 17/20]
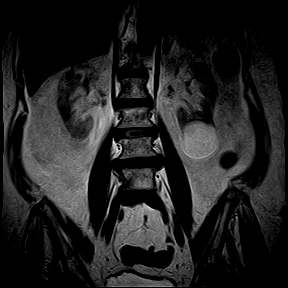
[im 20/20]
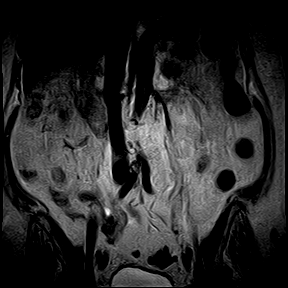

[43 of 48 positions shown; findings below may reference images not displayed]

FINDINGS: Bone marrow signal intensity is normal.  There is no acute fracture or subluxation.  Distal spinal cord is normal in signal intensity and terminates normally at L1 vertebral body level.  Spinal canal is congenitally narrow.  

L1-2 and L2-3 levels are unremarkable.  

At L3-4 level, there is a small broad-based central disc bulge resulting in severe spinal stenosis.  There is moderate bilateral neural foraminal stenosis from facet arthropathy and bulging annulus.  

At L4-5 level, there is Grade I anterolisthesis of L4 on L5 vertebral body.  There is suggestion of a left hemilaminectomy defect at this level. There is a small broad-based central disc bulge resulting severe spinal stenosis.  There is moderate bilateral neural foraminal stenosis from facet arthropathy and bulging annulus.

At 5-S1 level, there is moderate to severe right neural foraminal stenosis from facet and uncovertebral joint hypertrophy.  

Paraspinal soft tissues are unremarkable.  There is suggestion of bilateral renal cysts.
IMPRESSION: 1. Grade I anterolisthesis of L4 on L5 vertebral body.

2. Severe spinal stenosis at L3-4 and L4-5 levels from small central disc bulges.  

3. Suggestion of a left hemi laminectomy defect at L4-5 level.  

4. Multi-level neural foraminal stenosis as detailed above.  

5. Suggestion of bilateral renal cysts.  Follow up renal sonogram is recommended for better assessment.

## 2021-05-20 ENCOUNTER — Encounter (INDEPENDENT_AMBULATORY_CARE_PROVIDER_SITE_OTHER): Payer: Self-pay | Admitting: Nephrology

## 2021-05-20 DIAGNOSIS — D631 Anemia in chronic kidney disease: Secondary | ICD-10-CM

## 2021-05-20 DIAGNOSIS — N185 Chronic kidney disease, stage 5: Secondary | ICD-10-CM

## 2021-05-20 DIAGNOSIS — E876 Hypokalemia: Secondary | ICD-10-CM | POA: Insufficient documentation

## 2021-05-20 DIAGNOSIS — E119 Type 2 diabetes mellitus without complications: Secondary | ICD-10-CM | POA: Insufficient documentation

## 2021-05-20 DIAGNOSIS — E559 Vitamin D deficiency, unspecified: Secondary | ICD-10-CM | POA: Insufficient documentation

## 2021-05-20 NOTE — Progress Notes (Signed)
Plan  Chronic kidney disease  -Stage 5  -Due to diabetes and hypertension  -Creatinine is worse.  Condition is worse.  Creatinine up to 3.6.  Patient does not have uremic symptoms.  -Baseline creatinine 3.4  -We will check UA  -Total protein to creatinine ratio: 4.0 g/gram  -CBC and a basic metabolic panel  -Return to clinic in 1-2 months  -Continue low-sodium diet  -Fluid restriction less than 40 ounces a day  -Avoid NSAIDs  -No need for RRT yet  -Sodium bicarb for acidosis  -Patient does not want to be in pain.   -Given the patient age, functional status, she will be high risk for hemodialysis.  Continue medical treatment and monitoring     CKD bone mineral disease  - PTH  -Vitamin D level  -Calcitriol plus vitamin D supplements  -Hyperphosphatemia continue Renvela 800 3 times daily, will check the levels        Hypertension  -Blood pressure is acceptable  -Goal less than 130/80  -Continue amlodipine.  -Low-salt diet        Diastolic heart failure grade 1  -Continue Lasix 20 twice daily     Hyperphosphatemia  -On Renvela     -Fluid restriction 1.5 L

## 2021-05-26 ENCOUNTER — Encounter (INDEPENDENT_AMBULATORY_CARE_PROVIDER_SITE_OTHER): Payer: Self-pay | Admitting: Nephrology

## 2021-05-26 ENCOUNTER — Other Ambulatory Visit: Payer: Self-pay

## 2021-05-26 ENCOUNTER — Telehealth: Payer: Medicare (Managed Care) | Admitting: Nephrology

## 2021-05-26 VITALS — Ht 61.0 in | Wt 200.0 lb

## 2021-05-26 DIAGNOSIS — E870 Hyperosmolality and hypernatremia: Secondary | ICD-10-CM

## 2021-05-26 DIAGNOSIS — E559 Vitamin D deficiency, unspecified: Secondary | ICD-10-CM

## 2021-05-26 DIAGNOSIS — E1122 Type 2 diabetes mellitus with diabetic chronic kidney disease: Secondary | ICD-10-CM

## 2021-05-26 DIAGNOSIS — E876 Hypokalemia: Secondary | ICD-10-CM

## 2021-05-26 DIAGNOSIS — D631 Anemia in chronic kidney disease: Secondary | ICD-10-CM

## 2021-05-26 DIAGNOSIS — N185 Chronic kidney disease, stage 5: Secondary | ICD-10-CM

## 2021-05-26 DIAGNOSIS — I120 Hypertensive chronic kidney disease with stage 5 chronic kidney disease or end stage renal disease: Secondary | ICD-10-CM

## 2021-05-26 DIAGNOSIS — I5030 Unspecified diastolic (congestive) heart failure: Secondary | ICD-10-CM

## 2021-05-26 MED ORDER — POTASSIUM CHLORIDE ER 20 MEQ TABLET,EXTENDED RELEASE(PART/CRYST)
20.0000 meq | ORAL_TABLET | Freq: Every day | ORAL | 0 refills | Status: AC
Start: 2021-05-26 — End: 2021-05-31

## 2021-05-26 MED ORDER — ERGOCALCIFEROL (VITAMIN D2) 1,250 MCG (50,000 UNIT) CAPSULE
50000.0000 [IU] | ORAL_CAPSULE | ORAL | 0 refills | Status: DC
Start: 2021-05-26 — End: 2021-07-27

## 2021-05-26 NOTE — Progress Notes (Addendum)
[facility]    Progress Note    Name: Stephanie Wright MRN:  [id number]   Date: 05/26/2021 Age: 88 y.o.          Nephrology Follow Up Visit        This is a tele visit was performed over the phone family did not provide [place] with blood pressure.  HPI: 88 y.o. female with past medical history of CKD 5.  She reports that she is feeling well no nausea no vomiting no chest pain.  No shortness of breath.  No edema.  Reports good urine output.        ROS:         Systematic review of 12 organ systems was negative except what mentioned in in the HPI.      OBJECTIVE:   There were no vitals taken for this visit.    Physical exam was not done as this was a tele visit/phone call visit       LABORATORY DATA:   No results found for: BUN, CREATININE, BUNCRRATIO, GFR, SODIUM, POTASSIUM, CHLORIDE, CO2, ANIONGAP, CALCIUM, PHOSPHORUS, ALBUMIN, HGB, HCT, INTACTPTH, IRON, IRONBINDCAP, IRONSAT, FERRITIN, 25HYDVITD2D3, 25HYDVITD3    No results found for: MICROALBUMIN, TOTPROTCREAT, HA1C, URICACID     05/24/2021 hemoglobin 9.2 platelets 219 sodium 149, potassium 3.2 GFR 12 creatinine 3.47 BUN 28 glucose 137 A1c 7.2 carbon dioxide 27 calcium 8.7 vitamin-D 27.9 uric acid 8.5    MEDICATIONS:  No outpatient medications have been marked as taking for the 05/26/21 encounter (Appointment) with Beather Arbour, MD.     Current Outpatient Medications   Medication Instructions    amLODIPine (NORVASC) 10 mg Oral Tablet amlodipine 10 mg tablet    calcitrioL (ROCALTROL) 0.25 mcg, Oral, DAILY    cyanocobalamin (VITAMIN B 12) 1,000 mcg, Oral, DAILY    dicyclomine (BENTYL) 20 mg Oral Tablet dicyclomine 20 mg tablet    ergocalciferol (vitamin D2) (VITAMIN D) 50,000 Units, Oral, EVERY 7 DAYS    ferrous sulfate (FEOSOL) 325 mg, Oral, 3 TIMES DAILY WITH MEALS    furosemide (LASIX) 20 mg Oral Tablet furosemide 20 mg tablet    hydrALAZINE (APRESOLINE) 25 mg, Oral, 3 TIMES DAILY    magnesium oxide (MAG-OX) 400 mg, Oral, 2 TIMES DAILY     memantine (NAMENDA) 10 mg, Oral, 2 TIMES DAILY    olmesartan (BENICAR) 40 mg, Oral, DAILY    sevelamer carbonate (RENVELA) 800 mg Oral Tablet Oral, 3 TIMES DAILY WITH MEALS    SITagliptin phosphate (JANUVIA) 25 mg, Oral, DAILY         ASSESSMENT / PLAN:   No diagnosis found.                Plan  Chronic kidney disease  -Stage 5  -Due to diabetes and hypertension  -Creatinine is at baseline. Patient does not have uremic symptoms.  -Baseline creatinine 3.4.  Condition is stable  -Total protein to creatinine ratio: 4.0 g/gram  -CBC and a basic metabolic panel  -Return to clinic in 2 months  -Continue low-sodium diet  -Fluid restriction less than 40 ounces a day  -Avoid NSAIDs  -No need for RRT yet  -continue Sodium bicarb for acidosis  -Patient does not want to be in pain.   -Given the patient age, functional status, she will be high risk for hemodialysis.  Continue medical treatment and monitoring only     Anemia   -iron levels   - may need EPO  CKD bone mineral disease  - PTH  -Vitamin D level  -Calcitriol plus vitamin D supplements  -Hyperphosphatemia continue Renvela 800 3 times daily, will check the levels        Hypertension  -Blood pressure is acceptable  -Goal less than 130/80  -Continue amlodipine.  -Low-salt diet     Hypokalemia  -replace and monitor    Hypernatremia  -continue to monitor     Diastolic heart failure grade 1  -Continue Lasix 20 twice daily     Hyperphosphatemia  -On Renvela     -Fluid restriction 1.5 L    Time spent 35 min, telephone call

## 2021-06-06 ENCOUNTER — Encounter (RURAL_HEALTH_CENTER): Payer: Self-pay | Admitting: INTERNAL MEDICINE

## 2021-06-07 ENCOUNTER — Other Ambulatory Visit: Payer: Self-pay

## 2021-06-07 ENCOUNTER — Ambulatory Visit (RURAL_HEALTH_CENTER): Payer: Medicare (Managed Care) | Attending: INTERNAL MEDICINE | Admitting: INTERNAL MEDICINE

## 2021-06-07 ENCOUNTER — Encounter (RURAL_HEALTH_CENTER): Payer: Self-pay | Admitting: INTERNAL MEDICINE

## 2021-06-07 VITALS — BP 138/76 | HR 71 | Resp 16 | Ht 61.0 in | Wt 208.0 lb

## 2021-06-07 DIAGNOSIS — E559 Vitamin D deficiency, unspecified: Secondary | ICD-10-CM | POA: Insufficient documentation

## 2021-06-07 DIAGNOSIS — J309 Allergic rhinitis, unspecified: Secondary | ICD-10-CM | POA: Insufficient documentation

## 2021-06-07 DIAGNOSIS — R32 Unspecified urinary incontinence: Secondary | ICD-10-CM | POA: Insufficient documentation

## 2021-06-07 DIAGNOSIS — E876 Hypokalemia: Secondary | ICD-10-CM | POA: Insufficient documentation

## 2021-06-07 DIAGNOSIS — G8929 Other chronic pain: Secondary | ICD-10-CM | POA: Insufficient documentation

## 2021-06-07 DIAGNOSIS — I10 Essential (primary) hypertension: Secondary | ICD-10-CM | POA: Insufficient documentation

## 2021-06-07 DIAGNOSIS — D631 Anemia in chronic kidney disease: Secondary | ICD-10-CM | POA: Insufficient documentation

## 2021-06-07 DIAGNOSIS — I5032 Chronic diastolic (congestive) heart failure: Secondary | ICD-10-CM | POA: Insufficient documentation

## 2021-06-07 DIAGNOSIS — E119 Type 2 diabetes mellitus without complications: Secondary | ICD-10-CM

## 2021-06-07 DIAGNOSIS — N185 Chronic kidney disease, stage 5: Secondary | ICD-10-CM | POA: Insufficient documentation

## 2021-06-07 DIAGNOSIS — E1122 Type 2 diabetes mellitus with diabetic chronic kidney disease: Secondary | ICD-10-CM | POA: Insufficient documentation

## 2021-06-07 DIAGNOSIS — E785 Hyperlipidemia, unspecified: Secondary | ICD-10-CM | POA: Insufficient documentation

## 2021-06-07 DIAGNOSIS — N2581 Secondary hyperparathyroidism of renal origin: Secondary | ICD-10-CM

## 2021-06-07 DIAGNOSIS — E1169 Type 2 diabetes mellitus with other specified complication: Secondary | ICD-10-CM

## 2021-06-07 DIAGNOSIS — D508 Other iron deficiency anemias: Secondary | ICD-10-CM | POA: Insufficient documentation

## 2021-06-07 DIAGNOSIS — R609 Edema, unspecified: Secondary | ICD-10-CM | POA: Insufficient documentation

## 2021-06-07 DIAGNOSIS — Z7984 Long term (current) use of oral hypoglycemic drugs: Secondary | ICD-10-CM | POA: Insufficient documentation

## 2021-06-07 DIAGNOSIS — I132 Hypertensive heart and chronic kidney disease with heart failure and with stage 5 chronic kidney disease, or end stage renal disease: Secondary | ICD-10-CM | POA: Insufficient documentation

## 2021-06-07 DIAGNOSIS — K649 Unspecified hemorrhoids: Secondary | ICD-10-CM | POA: Insufficient documentation

## 2021-06-07 DIAGNOSIS — R569 Unspecified convulsions: Secondary | ICD-10-CM | POA: Insufficient documentation

## 2021-06-07 DIAGNOSIS — R0902 Hypoxemia: Secondary | ICD-10-CM | POA: Insufficient documentation

## 2021-06-07 MED ORDER — FERROUS SULFATE 325 MG (65 MG IRON) TABLET
325.0000 mg | ORAL_TABLET | Freq: Three times a day (TID) | ORAL | 1 refills | Status: DC
Start: 2021-06-07 — End: 2021-12-21

## 2021-06-07 MED ORDER — FUROSEMIDE 20 MG TABLET
ORAL_TABLET | ORAL | 1 refills | Status: DC
Start: 2021-06-07 — End: 2021-07-27

## 2021-06-07 MED ORDER — OLMESARTAN 40 MG TABLET
40.0000 mg | ORAL_TABLET | Freq: Every day | ORAL | 1 refills | Status: DC
Start: 2021-06-07 — End: 2021-12-21

## 2021-06-07 MED ORDER — AMLODIPINE 10 MG TABLET
ORAL_TABLET | ORAL | 1 refills | Status: DC
Start: 2021-06-07 — End: 2021-10-26

## 2021-06-07 MED ORDER — SITAGLIPTIN PHOSPHATE 25 MG TABLET
25.0000 mg | ORAL_TABLET | Freq: Every day | ORAL | 1 refills | Status: DC
Start: 2021-06-07 — End: 2021-12-21

## 2021-06-07 MED ORDER — HYDROCORTISONE ACETATE 25 MG RECTAL SUPPOSITORY
25.0000 mg | Freq: Two times a day (BID) | RECTAL | 1 refills | Status: AC | PRN
Start: 2021-06-07 — End: 2021-09-05

## 2021-06-07 MED ORDER — MAGNESIUM OXIDE 400 MG (241.3 MG MAGNESIUM) TABLET
400.0000 mg | ORAL_TABLET | Freq: Two times a day (BID) | ORAL | 1 refills | Status: DC
Start: 2021-06-07 — End: 2021-12-21

## 2021-06-07 MED ORDER — CYANOCOBALAMIN (VIT B-12) 1,000 MCG TABLET
1000.0000 ug | ORAL_TABLET | Freq: Every day | ORAL | 1 refills | Status: DC
Start: 2021-06-07 — End: 2021-07-14

## 2021-06-07 MED ORDER — MEMANTINE 10 MG TABLET
10.0000 mg | ORAL_TABLET | Freq: Two times a day (BID) | ORAL | 1 refills | Status: DC
Start: 2021-06-07 — End: 2021-12-21

## 2021-06-07 NOTE — Progress Notes (Signed)
[facility]  [address]  [place], [place]  [address]  Phone: [phone] Fax: [phone]    Name: Stephanie Wright                       Date of Birth: 24-Nov-1927   MRN:  [id number]                         Date of visit: 06/07/2021     Chief Complaint: Follow Up 3 Months (Blurry eyes./Right knee pain)    Current Outpatient Medications   Medication Sig   . amLODIPine (NORVASC) 10 mg Oral Tablet amlodipine 10 mg tablet   . calcitrioL (ROCALTROL) 0.25 mcg Oral Capsule Take 1 Capsule (0.25 mcg total) by mouth Once a day   . cyanocobalamin (VITAMIN B 12) 1,000 mcg Oral Tablet Take 1 Tablet (1,000 mcg total) by mouth Once a day   . dicyclomine (BENTYL) 20 mg Oral Tablet dicyclomine 20 mg tablet   . ergocalciferol, vitamin D2, (VITAMIN D) 1,250 mcg (50,000 unit) Oral Capsule Take 1 Capsule (50,000 Units total) by mouth Every 7 days for 56 days   . ferrous sulfate (FEOSOL) 325 mg (65 mg iron) Oral Tablet Take 1 Tablet (325 mg total) by mouth Three times daily with meals   . furosemide (LASIX) 20 mg Oral Tablet furosemide 20 mg tablet   . hydrALAZINE (APRESOLINE) 25 mg Oral Tablet Take 1 Tablet (25 mg total) by mouth Three times a day   . magnesium oxide (MAG-OX) 400 mg Oral Tablet Take 1 Tablet (400 mg total) by mouth Twice daily   . memantine (NAMENDA) 10 mg Oral Tablet Take 1 Tablet (10 mg total) by mouth Twice daily   . olmesartan (BENICAR) 40 mg Oral Tablet Take 1 Tablet (40 mg total) by mouth Once a day   . sevelamer carbonate (RENVELA) 800 mg Oral Tablet Take by mouth Three times daily with meals   . SITagliptin phosphate (JANUVIA) 25 mg Oral Tablet Take 1 Tablet (25 mg total) by mouth Once a day       Patient Active Problem List    Diagnosis Date Noted   . Hyperlipidemia 06/07/2021   . Benign hypertension 06/07/2021   . Iron deficiency anemia secondary to inadequate dietary iron intake 06/07/2021   . Chronic diastolic CHF (congestive heart failure) (CMS HCC) 06/07/2021   . Hypoxia 06/07/2021   . CKD  (chronic kidney disease) stage 5, GFR less than 15 ml/min (CMS HCC) 05/20/2021   . Vitamin D deficiency 05/20/2021   . Anemia in chronic kidney disease (CKD) 05/20/2021   . Hypomagnesemia 05/20/2021   . Hypokalemia 05/20/2021   . Type 2 diabetes mellitus (CMS [place]) 05/20/2021       Subjective:   Patient is here for CDM visit.    Complaining of blurred vision.     Also complaining of more joint pain, especially right knee.     Chronic pain  Doing fair at best with Tylenol  Should avoid ibuprofen due to renal function  Was on opiates previously, but had several inconsistent urine drug screens.  Patient has a family members with known dependence and abuse issues.  All controlled medications were discontinued    Adrenal Cyst  Has associated pain. Intermittant. Patient had surgery scheduled and then pain improved. Refused surgery when the pain initially resolved. But still having intermittant issues.    Diabetes mellitus  On renal dose Januvia  was on metformin, but stopped 2/2 renal dysfunction  Patient admits to noncompliance  Refused medication adjustment  Labs on 07-10-2019 showed hemoglobin A1c 7.6  Labs on 01-15-2020 showed hemoglobin A1c 6.8  Labs on 12-06-2020 showed hemoglobin A1c 8.9  7.2    Hyperlipidemia  Refuses medication  Labs on 07-10-2019 showed total cholesterol 255, triglycerides 170, HDL 70, direct LDL 143  Labs on 01-15-2020 showed total cholesterol 150, triglycerides 154, HDL 81, direct LDL 135  Labs on 12-06-2020 showed total cholesterol 181, triglycerides 213, HDL 52, calculated LDL 86    Hypertension  Doing well with olmesartan, amlodipine, hydralazine  Dr Marcille Blanco said we could stop amlodipine 2/2 edema, but BP elevated    Iron deficiency anemia  Doing well with replacement  Labs on 07-10-2019 showed hemoglobin 12.5, normocytic  Labs on 01-15-2020 showed hemoglobin 10.8, normocytic, normal iron studies  Labs on 12-31-2021 showed hemoglobin 11.2, normocytic  03-02-21 9.2    Edema  Probably  combination of CKD and iatrogenic  Doing well with Lasix    Muscle pain  no longer using quinine    Allergic rhinitis  Doing well with Zyrtec    Hypomagnesemia  Suspect iatrogenic from Lasix  Doing well with replacement    Hypokalemia  Suspect iatrogenic from Lasix  Was on potassium replacement    CDCHF  Following with Dr Rayburn Ma [phone]  Had Echo last visit    CKD  Stage 4-5  Improving after CHF flare 07-2020  Following with Dr Monia Pouch  Patient/Family do not want Dialysis. Patient is very high risk anyway.   [id number] Worsened again  02-2021 worsened again  05-2021 improved. gfr 12    Gait failure  Difficulty staying upright.  1-2 person assist depending on the day.  Very unsteady with moving.  Needs to use a cane or a walker when ambulating, however patient typically refuses.  Primarily relies on walls, furniture, and family members.  Or uses transport chair when leaving home. Had tried and failed physical therapy.  03-2019 Has not really improved since her discharge.  06-2019 Still has minimal improvement. This appears to be her new baseline.  10-2019 worse after Covid  03-2020 having some improvement.  Close to what it was about this time last year  06-2020 Plateaued. Essentially the same as it was in early 2021.   * Seeing Dr Bosie Helper. Scheduled for imaging. Also scheduled for outpatient therapy.  08-2020 stopped PT as was not showing much benefit per patient    Hypoxia  O2 sats now usually running in 90s some desats at night.  using and benefitting from O2    Dementia  Multifactorial    Waxing and waning    ROS:  10 systems reviewed and were negative except as noted.   + fatigue  + fecal incontinence, diarrhea  + urinary incontinence, urinary urgency  + abnormal gait, back pain, joint pain, muscle weakness  + confusion    Objective :  BP 138/76 (Site: Left, Patient Position: Sitting, Cuff Size: Adult)   Pulse 71   Resp 16   Ht 1.549 m (5\' 1" )   Wt 94.3 kg (208 lb)   SpO2 93%   BMI 39.30 kg/m        General:  appears chronically ill  Lungs:  Clear to auscultation bilaterally.   Cardiovascular:  regular rate and rhythm  Neurologic:  CN II - XII grossly intact   Psychiatric:  Memory: Impaired    Data reviewed:  Nephrology note  Daughter brought labs from [employer].     Assessment/Plan:  Assessment/Plan   1. CKD (chronic kidney disease) stage 5, GFR less than 15 ml/min (CMS HCC)    2. Type 2 diabetes mellitus with other specified complication, without long-term current use of insulin (CMS HCC)    3. Hyperlipidemia, unspecified hyperlipidemia type    4. Benign hypertension    5. Anemia in stage 5 chronic kidney disease, not on chronic dialysis (CMS HCC)    6. Iron deficiency anemia secondary to inadequate dietary iron intake    7. Hypomagnesemia    8. Hypokalemia    9. Chronic diastolic CHF (congestive heart failure) (CMS HCC)    10. Hypoxia    11. Secondary hyperparathyroidism (CMS [place])    12. Type 2 diabetes mellitus (CMS HCC)      Chronic pain  Continue over-the-counter treatment    CDCHF  continue diuretics and fluid restriction    CKD  Follow with Nephro    Seizure  Suspect metabolic etiology  follow with neuro    Vitamin D Deficiency/Renal hyperparathyroidism  Continue Vitamin D, calcitriol    Diabetes  Monitor    Hypertension  Continue olmesartan, amlodipine  Better    Hyperlipidemia  Monitor    Iron deficiency anemia  Continue replacement    Edema  Continue Lasix    Hypomagnesemia/hypokalemia  Continue tablet replacement    Myalgia  monitor    Allergic rhinitis  Continue Zyrtec    Unsteady gait  Follow with Neuro    Urinary incontinence/Fecal Incontince  Diapers and pads.    Blurred vision  Not sure of etiology  Hard to get patient to specialist  Monitor for now    Try suppository for hemorrhoids    Moderate decision making. >3 CDM, labs, meds.    Casimer Lanius, DO, [facility]  Internal Medicine

## 2021-06-07 NOTE — Nursing Note (Signed)
Patient is here for her follow up. Patient c/o blurry eyes and right knee pain.

## 2021-06-27 ENCOUNTER — Other Ambulatory Visit (RURAL_HEALTH_CENTER): Payer: Self-pay | Admitting: INTERNAL MEDICINE

## 2021-06-27 MED ORDER — CETIRIZINE 10 MG TABLET
10.0000 mg | ORAL_TABLET | Freq: Every day | ORAL | 1 refills | Status: DC
Start: 2021-06-27 — End: 2021-12-21

## 2021-07-14 ENCOUNTER — Other Ambulatory Visit (RURAL_HEALTH_CENTER): Payer: Self-pay | Admitting: INTERNAL MEDICINE

## 2021-07-14 MED ORDER — CYANOCOBALAMIN (VIT B-12) 1,000 MCG TABLET
1000.0000 ug | ORAL_TABLET | Freq: Every day | ORAL | 1 refills | Status: DC
Start: 2021-07-14 — End: 2021-12-21

## 2021-07-27 ENCOUNTER — Ambulatory Visit: Payer: Medicare (Managed Care) | Admitting: Nephrology

## 2021-07-27 ENCOUNTER — Other Ambulatory Visit: Payer: Self-pay

## 2021-07-27 ENCOUNTER — Encounter (INDEPENDENT_AMBULATORY_CARE_PROVIDER_SITE_OTHER): Payer: Self-pay | Admitting: Nephrology

## 2021-07-27 VITALS — BP 156/77 | HR 61 | Ht 61.0 in | Wt 208.0 lb

## 2021-07-27 DIAGNOSIS — N185 Chronic kidney disease, stage 5: Secondary | ICD-10-CM

## 2021-07-27 MED ORDER — SEVELAMER CARBONATE 800 MG TABLET
800.0000 mg | ORAL_TABLET | Freq: Three times a day (TID) | ORAL | 3 refills | Status: DC
Start: 2021-07-27 — End: 2021-10-26

## 2021-07-27 MED ORDER — ERGOCALCIFEROL (VITAMIN D2) 1,250 MCG (50,000 UNIT) CAPSULE
50000.0000 [IU] | ORAL_CAPSULE | ORAL | 0 refills | Status: AC
Start: 2021-07-27 — End: 2021-09-21

## 2021-07-27 MED ORDER — FUROSEMIDE 20 MG TABLET
20.0000 mg | ORAL_TABLET | Freq: Two times a day (BID) | ORAL | 1 refills | Status: DC
Start: 2021-07-27 — End: 2021-12-21

## 2021-07-27 NOTE — Progress Notes (Addendum)
[place]    Progress Note    Name: Stephanie Wright MRN:  [id number]   Date: 07/27/2021 Age: 88 y.o.          Nephrology Follow Up Visit        This is a tele visit was performed over the phone family did not provide [place] with blood pressure.  Time spent on the visit was 25 minutes  HPI: 88 y.o. female with past medical history of CKD 5.  She reports that she is feeling weak, no nausea no vomiting no chest pain.  No shortness of breath.  Family reported edema on the back as patient is more bedridden no edema in the legs.  Urine output is acceptable per family.  Lab work reviewed with the patient over the phone and her daughter.  Did not want to proceed with dialysis.  Hospice was recommended.    ROS:         Systematic review of 12 organ systems was negative except what mentioned in in the HPI.      OBJECTIVE:   BP (!) 156/77 (Site: Upper Extremity, Patient Position: Sitting, Cuff Size: Adult)   Pulse 61   Ht 1.549 m (5\' 1" )   Wt 94.3 kg (208 lb)   BMI 39.30 kg/m     Physical exam was not done as this was a tele visit/phone call visit       LABORATORY DATA:   No results found for: BUN, CREATININE, BUNCRRATIO, GFR, SODIUM, POTASSIUM, CHLORIDE, CO2, ANIONGAP, CALCIUM, PHOSPHORUS, ALBUMIN, HGB, HCT, INTACTPTH, IRON, IRONBINDCAP, IRONSAT, FERRITIN, 25HYDVITD2D3, 25HYDVITD3    No results found for: MICROALBUMIN, TOTPROTCREAT, HA1C, URICACID     07/25/2021 hemoglobin 10.9 creatinine 4.0 GFR 10 sodium 146 albumin 3.3 chloride 110 glucose 129 BUN 42 potassium 3.8 carbon dioxide 22 vitamin-D 45.5  Iron level 22, binding capacity 158 low.  LDL 77 uric acid 7.7 magnesium 2.3  05/24/2021 hemoglobin 9.2 platelets 219 sodium 149, potassium 3.2 GFR 12 creatinine 3.47 BUN 28 glucose 137 A1c 7.2 carbon dioxide 27 calcium 8.7 vitamin-D 27.9 uric acid 8.5    MEDICATIONS:  Outpatient Medications Marked as Taking for the 07/27/21 encounter (Office Visit) with Beather Arbour, MD   Medication Sig     amLODIPine (NORVASC) 10 mg Oral Tablet amlodipine 10 mg tablet    calcitrioL (ROCALTROL) 0.25 mcg Oral Capsule Take 1 Capsule (0.25 mcg total) by mouth Once a day    cetirizine (ZYRTEC) 10 mg Oral Tablet Take 1 Tablet (10 mg total) by mouth Once a day for 180 days    cyanocobalamin (VITAMIN B 12) 1,000 mcg Oral Tablet Take 1 Tablet (1,000 mcg total) by mouth Once a day for 180 days    dicyclomine (BENTYL) 20 mg Oral Tablet dicyclomine 20 mg tablet    ergocalciferol, vitamin D2, (VITAMIN D) 1,250 mcg (50,000 unit) Oral Capsule Take 1 Capsule (50,000 Units total) by mouth Every 7 days for 56 days    ferrous sulfate (FEOSOL) 325 mg (65 mg iron) Oral Tablet Take 1 Tablet (325 mg total) by mouth Three times daily with meals for 180 days    furosemide (LASIX) 20 mg Oral Tablet Take 1 Tablet (20 mg total) by mouth Twice daily for 180 days    hydrALAZINE (APRESOLINE) 25 mg Oral Tablet Take 1 Tablet (25 mg total) by mouth Three times a day    hydrocortisone acetate (ANUSOL-HC) 25 mg Rectal Suppository Insert 1 Suppository (25 mg total)  into the rectum Twice per day as needed for up to 90 days    magnesium oxide (MAG-OX) 400 mg Oral Tablet Take 1 Tablet (400 mg total) by mouth Twice daily for 180 days    memantine (NAMENDA) 10 mg Oral Tablet Take 1 Tablet (10 mg total) by mouth Twice daily for 180 days    olmesartan (BENICAR) 40 mg Oral Tablet Take 1 Tablet (40 mg total) by mouth Once a day for 180 days    sevelamer carbonate (RENVELA) 800 mg Oral Tablet Take 1 Tablet (800 mg total) by mouth Three times daily with meals for 90 days    SITagliptin phosphate (JANUVIA) 25 mg Oral Tablet Take 1 Tablet (25 mg total) by mouth Once a day for 180 days     Current Outpatient Medications   Medication Instructions    amLODIPine (NORVASC) 10 mg Oral Tablet amlodipine 10 mg tablet    calcitrioL (ROCALTROL) 0.25 mcg, Oral, DAILY    cetirizine (ZYRTEC) 10 mg, Oral, DAILY    cyanocobalamin (VITAMIN B 12) 1,000 mcg, Oral, DAILY    dicyclomine  (BENTYL) 20 mg Oral Tablet dicyclomine 20 mg tablet    ergocalciferol (vitamin D2) (VITAMIN D) 50,000 Units, Oral, EVERY 7 DAYS    ferrous sulfate (FEOSOL) 325 mg, Oral, 3 TIMES DAILY WITH MEALS    furosemide (LASIX) 20 mg, Oral, 2 TIMES DAILY    hydrALAZINE (APRESOLINE) 25 mg, Oral, 3 TIMES DAILY    hydrocortisone acetate (ANUSOL-HC) 25 mg, Rectal, 2 TIMES DAILY PRN    magnesium oxide (MAG-OX) 400 mg, Oral, 2 TIMES DAILY    memantine (NAMENDA) 10 mg, Oral, 2 TIMES DAILY    olmesartan (BENICAR) 40 mg, Oral, DAILY    sevelamer carbonate (RENVELA) 800 mg, Oral, 3 TIMES DAILY WITH MEALS    SITagliptin phosphate (JANUVIA) 25 mg, Oral, DAILY         ASSESSMENT / PLAN:   ENCOUNTER DIAGNOSES     ICD-10-CM   1. CKD (chronic kidney disease) stage 5, GFR less than 15 ml/min (CMS HCC)  N18.5                   Plan  Chronic kidney disease  -Stage 5  -Due to diabetes and hypertension  -Creatinine is at baseline. Patient does not have uremic symptoms.  -Baseline creatinine 3.4.  Creatinine is 4.  Condition is worse.  GFR is 10.  -Total protein to creatinine ratio: 4.0 g/gram  -CBC and a basic metabolic panel  -Return to clinic in 2-3 months  -Continue low-sodium diet  -Fluid restriction less than 40 ounces a day  -Avoid NSAIDs  -family does not want to proceed with dialysis.  -continue Sodium bicarb for acidosis  -Patient does not want to be in pain.   -no dialysis per family and patient request  -recommend hospice patient will be a good candidate for hospice and palliative care.  Patient has advanced CKD.     Anemia   -iron levels reviewed.  - no need EPO     CKD bone mineral disease  - PTH  -Vitamin D level  -Calcitriol plus vitamin D supplements  -Hyperphosphatemia continue Renvela 800 3 times daily, will check the levels        Hypertension  -Blood pressure is acceptable  -Goal less than 130/80  -Continue amlodipine.  -Low-salt diet     Hypokalemia  -resolved    Hypernatremia  -improved    Diastolic heart failure grade  1  -Continue  Lasix 20 twice daily.  Refill was provided     Hyperphosphatemia  -On Renvela     -Fluid restriction 1.5 L    Time spent 25  min, telephone call over the facetime pts' daughter was present in the clinic here and the pt was at home.  Pt and her daughter gave a verbal consent for the tele visit

## 2021-08-09 ENCOUNTER — Other Ambulatory Visit (RURAL_HEALTH_CENTER): Payer: Self-pay | Admitting: INTERNAL MEDICINE

## 2021-08-09 DIAGNOSIS — R4182 Altered mental status, unspecified: Secondary | ICD-10-CM

## 2021-08-09 DIAGNOSIS — N185 Chronic kidney disease, stage 5: Secondary | ICD-10-CM

## 2021-08-25 ENCOUNTER — Telehealth (RURAL_HEALTH_CENTER): Payer: Self-pay | Admitting: INTERNAL MEDICINE

## 2021-08-25 NOTE — Telephone Encounter (Signed)
Apolonio Schneiders stopped by and wanted to know if you wanted any labs on her mom.  She has an appointment with you 09/06/21.

## 2021-08-25 NOTE — Telephone Encounter (Signed)
She had labs drawn for me last month. Nothing is really due yet.

## 2021-08-30 ENCOUNTER — Other Ambulatory Visit (RURAL_HEALTH_CENTER): Payer: Self-pay | Admitting: INTERNAL MEDICINE

## 2021-08-30 ENCOUNTER — Telehealth (RURAL_HEALTH_CENTER): Payer: Self-pay | Admitting: INTERNAL MEDICINE

## 2021-08-30 MED ORDER — NYSTATIN 100,000 UNIT/GRAM TOPICAL CREAM
TOPICAL_CREAM | Freq: Two times a day (BID) | CUTANEOUS | 1 refills | Status: DC
Start: 2021-08-30 — End: 2021-12-21

## 2021-08-30 NOTE — Telephone Encounter (Signed)
Apolonio Schneiders said that patient has a rash under her breasts and wants to know if you can send something in for her?

## 2021-09-06 ENCOUNTER — Encounter (RURAL_HEALTH_CENTER): Payer: Self-pay | Admitting: INTERNAL MEDICINE

## 2021-09-06 ENCOUNTER — Other Ambulatory Visit: Payer: Self-pay

## 2021-09-06 ENCOUNTER — Ambulatory Visit (RURAL_HEALTH_CENTER): Payer: Medicare (Managed Care) | Attending: INTERNAL MEDICINE | Admitting: INTERNAL MEDICINE

## 2021-09-06 VITALS — BP 132/69 | HR 72 | Temp 98.2°F | Resp 18

## 2021-09-06 DIAGNOSIS — E559 Vitamin D deficiency, unspecified: Secondary | ICD-10-CM

## 2021-09-06 DIAGNOSIS — I5032 Chronic diastolic (congestive) heart failure: Secondary | ICD-10-CM | POA: Insufficient documentation

## 2021-09-06 DIAGNOSIS — D508 Other iron deficiency anemias: Secondary | ICD-10-CM

## 2021-09-06 DIAGNOSIS — E1169 Type 2 diabetes mellitus with other specified complication: Secondary | ICD-10-CM | POA: Insufficient documentation

## 2021-09-06 DIAGNOSIS — E785 Hyperlipidemia, unspecified: Secondary | ICD-10-CM

## 2021-09-06 DIAGNOSIS — R32 Unspecified urinary incontinence: Secondary | ICD-10-CM | POA: Insufficient documentation

## 2021-09-06 DIAGNOSIS — R0902 Hypoxemia: Secondary | ICD-10-CM

## 2021-09-06 DIAGNOSIS — D631 Anemia in chronic kidney disease: Secondary | ICD-10-CM | POA: Insufficient documentation

## 2021-09-06 DIAGNOSIS — I10 Essential (primary) hypertension: Secondary | ICD-10-CM

## 2021-09-06 DIAGNOSIS — N185 Chronic kidney disease, stage 5: Secondary | ICD-10-CM | POA: Insufficient documentation

## 2021-09-06 DIAGNOSIS — E876 Hypokalemia: Secondary | ICD-10-CM | POA: Insufficient documentation

## 2021-09-06 NOTE — Progress Notes (Signed)
[facility]  [address]  [place], [place]  [address]  Phone: [phone] Fax: [phone]    Name: Stephanie Wright                       Date of Birth: [birth year]   MRN:  [id number]                         Date of visit: 09/06/2021     Chief Complaint: Follow Up 3 Months (Telehealth  visit)    Current Outpatient Medications   Medication Sig    amLODIPine (NORVASC) 10 mg Oral Tablet amlodipine 10 mg tablet    calcitrioL (ROCALTROL) 0.25 mcg Oral Capsule Take 1 Capsule (0.25 mcg total) by mouth Once a day    cetirizine (ZYRTEC) 10 mg Oral Tablet Take 1 Tablet (10 mg total) by mouth Once a day for 180 days    cyanocobalamin (VITAMIN B 12) 1,000 mcg Oral Tablet Take 1 Tablet (1,000 mcg total) by mouth Once a day for 180 days    dicyclomine (BENTYL) 20 mg Oral Tablet dicyclomine 20 mg tablet    ergocalciferol, vitamin D2, (VITAMIN D) 1,250 mcg (50,000 unit) Oral Capsule Take 1 Capsule (50,000 Units total) by mouth Every 7 days for 56 days    ferrous sulfate (FEOSOL) 325 mg (65 mg iron) Oral Tablet Take 1 Tablet (325 mg total) by mouth Three times daily with meals for 180 days    furosemide (LASIX) 20 mg Oral Tablet Take 1 Tablet (20 mg total) by mouth Twice daily for 180 days    hydrALAZINE (APRESOLINE) 25 mg Oral Tablet Take 1 Tablet (25 mg total) by mouth Three times a day    magnesium oxide (MAG-OX) 400 mg Oral Tablet Take 1 Tablet (400 mg total) by mouth Twice daily for 180 days    memantine (NAMENDA) 10 mg Oral Tablet Take 1 Tablet (10 mg total) by mouth Twice daily for 180 days    nystatin (MYCOSTATIN) 100,000 unit/gram Cream Apply topically Twice daily    olmesartan (BENICAR) 40 mg Oral Tablet Take 1 Tablet (40 mg total) by mouth Once a day for 180 days    sevelamer carbonate (RENVELA) 800 mg Oral Tablet Take 1 Tablet (800 mg total) by mouth Three times daily with meals for 90 days    SITagliptin phosphate (JANUVIA) 25 mg Oral Tablet Take 1 Tablet (25 mg total) by mouth Once a day for 180 days        Patient Active Problem List    Diagnosis Date Noted    Urinary incontinence 09/06/2021    Hyperlipidemia 06/07/2021    Benign hypertension 06/07/2021    Iron deficiency anemia secondary to inadequate dietary iron intake 06/07/2021    Chronic diastolic CHF (congestive heart failure) (CMS HCC) 06/07/2021    Hypoxia 06/07/2021    CKD (chronic kidney disease) stage 5, GFR less than 15 ml/min (CMS HCC) 05/20/2021    Vitamin D deficiency 05/20/2021    Anemia in chronic kidney disease (CKD) 05/20/2021    Hypomagnesemia 05/20/2021    Hypokalemia 05/20/2021    Type 2 diabetes mellitus (CMS [place]) 05/20/2021       Subjective:   Patient is here for CDM visit. On phone with daughter. One daughter physically in office.     Family requested telehealth visit due to difficulty in getting patient out of home. Daughter provided vital signs.   Patient was evaluated via video  chat.   Entirety of history and information updated from daughter.     Eating baby food and ensure. When she is having better days, they are providing blended food.   Is having some trouble swallowing meds on some days.     Chronic pain  Doing fair at best with Tylenol  Should avoid ibuprofen due to renal function  Was on opiates previously, but had several inconsistent urine drug screens.  Patient has a family members with known dependence and abuse issues.  All controlled medications were discontinued    Adrenal Cyst  Has associated pain. Intermittant. Patient had surgery scheduled and then pain improved. Refused surgery when the pain initially resolved. But still having intermittant issues.    Diabetes mellitus  On renal dose Januvia  was on metformin, but stopped 2/2 renal dysfunction  Patient admits to noncompliance  Refused medication adjustment  Labs on 07-10-2019 showed hemoglobin A1c 7.6  Labs on 01-15-2020 showed hemoglobin A1c 6.8  Labs on 12-06-2020 showed hemoglobin A1c 8.9  Labs on 07/26/2021 showed hemoglobin A1c 6.7    Hyperlipidemia  Refuses  medication  Labs on 07-10-2019 showed total cholesterol 255, triglycerides 170, HDL 70, direct LDL 143  Labs on 01-15-2020 showed total cholesterol 150, triglycerides 154, HDL 81, direct LDL 135  Labs on 12-06-2020 showed total cholesterol 181, triglycerides 213, HDL 52, calculated LDL 86  Labs on 07/26/2021 showed total cholesterol 142, triglycerides 126, HDL 41, calculated LDL 78    Hypertension  Doing well with olmesartan, amlodipine, hydralazine  Dr Marcille Blanco said we could stop amlodipine 2/2 edema, but BP elevated    Iron deficiency anemia  Doing well with replacement  Labs on 07-10-2019 showed hemoglobin 12.5, normocytic  Labs on 01-15-2020 showed hemoglobin 10.8, normocytic, normal iron studies  Labs on 12-31-2021 showed hemoglobin 11.2, normocytic  03-02-21 9.2  Labs on 07/26/2021 showed hemoglobin 10.9    Edema  Probably combination of CKD and iatrogenic  Doing well with Lasix    Muscle pain  no longer using quinine    Allergic rhinitis  Doing well with Zyrtec    Hypomagnesemia  Suspect iatrogenic from Lasix  Doing well with replacement  Labs on 07/26/2021 showed magnesium 2.3    Hypokalemia  Suspect iatrogenic from Lasix  Was on potassium replacement  Labs on 07/26/2021 showed potassium 3.8    CDCHF  Following with Dr Rayburn Ma [phone]    CKD  Stage 4-5  Improving after CHF flare 07-2020  Following with Dr Monia Pouch  Patient/Family do not want Dialysis. Patient is very high risk anyway.   [id number] Worsened again  02-2021 worsened again  05-2021 improved. gfr 12  07-2021 slightly worse. GFR 10. Nephro suggesting Hospice. Family not ready.     Gait failure  Difficulty staying upright.  1-2 person assist depending on the day.  Very unsteady with moving.  Needs to use a cane or a walker when ambulating, however patient typically refuses.  Primarily relies on walls, furniture, and family members.  Or uses transport chair when leaving home. Had tried and failed physical therapy.  03-2019 Has not really improved  since her discharge.  06-2019 Still has minimal improvement. This appears to be her new baseline.  10-2019 worse after Covid  03-2020 having some improvement.  Close to what it was about this time last year  06-2020 Plateaued. Essentially the same as it was in early 2021.   * Seeing Dr Bosie Helper. Scheduled for imaging. Also scheduled for outpatient therapy.  08-2020 stopped PT as was not showing much benefit per patient    Hypoxia  O2 sats now usually running in 90s some desats at night.  using and benefitting from O2    Dementia  Multifactorial  Waxing and waning    ROS:  10 systems reviewed and were negative except as noted.   UTO 2/2 mentation and hearing loss    Objective :  BP 132/69 (Site: Left, Patient Position: Supine, Cuff Size: Adult)   Pulse 72   Temp 36.8 C (98.2 F) (Oral)   Resp 18   SpO2 96%       General:  appears chronically ill, vital signs reviewed, and confused  Neurologic:  confused  Psychiatric:  confused  Left facial droop  Patient has difficulty hearing on phone    Data reviewed:  Nephrology note  Daughter brought labs from [place].     Assessment/Plan:  Assessment/Plan   1. Type 2 diabetes mellitus with other specified complication, without long-term current use of insulin (CMS HCC)    2. Benign hypertension    3. Hyperlipidemia, unspecified hyperlipidemia type    4. Chronic diastolic CHF (congestive heart failure) (CMS HCC)    5. Hypoxia    6. CKD (chronic kidney disease) stage 5, GFR less than 15 ml/min (CMS HCC)    7. Anemia in stage 5 chronic kidney disease, not on chronic dialysis (CMS HCC)    8. Vitamin D deficiency    9. Iron deficiency anemia secondary to inadequate dietary iron intake    10. Hypokalemia    11. Hypomagnesemia    12. Urinary incontinence, unspecified type        Chronic pain  Continue over-the-counter treatment    CDCHF  continue diuretics and fluid restriction    CKD  Follow with Nephro    Seizure  Suspect metabolic etiology  follow with neuro    Vitamin D  Deficiency/Renal hyperparathyroidism  Continue Vitamin D, calcitriol    Diabetes  Monitor    Hypertension  Continue olmesartan, amlodipine  Better    Hyperlipidemia  Monitor    Iron deficiency anemia  Continue replacement    Edema  Continue Lasix    Hypomagnesemia/hypokalemia  Continue tablet replacement    Myalgia  monitor    Allergic rhinitis  Continue Zyrtec    Unsteady gait  Follow with Neuro    Urinary incontinence/Fecal Incontince  Diapers and pads.    Counseling has been provided.     The patient/family initiated a request for telephone service.  Verbal consent for this service was obtained from the patient/family.    Casimer Lanius, DO, [facility]  Internal Medicine

## 2021-09-06 NOTE — Nursing Note (Signed)
Patient was unable to come today so she is doing a telehealth visit.

## 2021-09-09 ENCOUNTER — Other Ambulatory Visit (RURAL_HEALTH_CENTER): Payer: Self-pay | Admitting: INTERNAL MEDICINE

## 2021-09-09 MED ORDER — LEVETIRACETAM 500 MG TABLET
500.0000 mg | ORAL_TABLET | Freq: Two times a day (BID) | ORAL | 1 refills | Status: DC
Start: 2021-09-09 — End: 2022-03-02

## 2021-09-21 ENCOUNTER — Telehealth (INDEPENDENT_AMBULATORY_CARE_PROVIDER_SITE_OTHER): Payer: Self-pay | Admitting: Nephrology

## 2021-09-27 ENCOUNTER — Telehealth (INDEPENDENT_AMBULATORY_CARE_PROVIDER_SITE_OTHER): Payer: Self-pay

## 2021-09-27 DIAGNOSIS — L299 Pruritus, unspecified: Secondary | ICD-10-CM

## 2021-09-27 DIAGNOSIS — N185 Chronic kidney disease, stage 5: Secondary | ICD-10-CM

## 2021-09-27 MED ORDER — DIPHENHYDRAMINE-ZINC ACETATE 1 %-0.1 % TOPICAL CREAM
TOPICAL_CREAM | Freq: Three times a day (TID) | CUTANEOUS | 0 refills | Status: DC | PRN
Start: 2021-09-27 — End: 2021-12-21

## 2021-09-27 NOTE — Telephone Encounter (Signed)
09/27/2021   Complains of itching . States there is no rash or redness. Instructed to apply benadryl cream up to three times daily as needed. Will instruct to use emollients unscented cream  and plain soaps such as dial or dove unscented. Called patient no answer at this time.

## 2021-09-29 ENCOUNTER — Telehealth (RURAL_HEALTH_CENTER): Payer: Self-pay | Admitting: INTERNAL MEDICINE

## 2021-09-29 NOTE — Telephone Encounter (Signed)
Stephanie Wright is unsure but she is going to call the pharmacy to check.

## 2021-09-29 NOTE — Telephone Encounter (Signed)
Patient's daughter requesting refill on Zofran.  She also wants to know if you can send something in for itching because cortisone cream is not helping.

## 2021-09-29 NOTE — Telephone Encounter (Signed)
Is she taking the hydralazine? Because that is the typical treatment for itching.  Otherwise I'll have to see what other options we have.

## 2021-10-26 ENCOUNTER — Encounter (INDEPENDENT_AMBULATORY_CARE_PROVIDER_SITE_OTHER): Payer: Self-pay | Admitting: Nephrology

## 2021-10-26 ENCOUNTER — Other Ambulatory Visit: Payer: Self-pay

## 2021-10-26 ENCOUNTER — Telehealth: Payer: Medicare (Managed Care) | Admitting: Nephrology

## 2021-10-26 VITALS — BP 194/82 | HR 73 | Ht 62.0 in | Wt 155.0 lb

## 2021-10-26 DIAGNOSIS — I130 Hypertensive heart and chronic kidney disease with heart failure and stage 1 through stage 4 chronic kidney disease, or unspecified chronic kidney disease: Secondary | ICD-10-CM

## 2021-10-26 DIAGNOSIS — E119 Type 2 diabetes mellitus without complications: Secondary | ICD-10-CM

## 2021-10-26 DIAGNOSIS — N185 Chronic kidney disease, stage 5: Secondary | ICD-10-CM

## 2021-10-26 DIAGNOSIS — I5030 Unspecified diastolic (congestive) heart failure: Secondary | ICD-10-CM

## 2021-10-26 DIAGNOSIS — E559 Vitamin D deficiency, unspecified: Secondary | ICD-10-CM

## 2021-10-26 DIAGNOSIS — E1122 Type 2 diabetes mellitus with diabetic chronic kidney disease: Secondary | ICD-10-CM

## 2021-10-26 DIAGNOSIS — D631 Anemia in chronic kidney disease: Secondary | ICD-10-CM

## 2021-10-26 DIAGNOSIS — I10 Essential (primary) hypertension: Secondary | ICD-10-CM

## 2021-10-26 MED ORDER — ONDANSETRON HCL 4 MG TABLET
4.0000 mg | ORAL_TABLET | Freq: Three times a day (TID) | ORAL | 0 refills | Status: DC | PRN
Start: 2021-10-26 — End: 2021-12-21

## 2021-10-26 MED ORDER — AMLODIPINE 5 MG TABLET
5.0000 mg | ORAL_TABLET | Freq: Every day | ORAL | 3 refills | Status: DC
Start: 2021-10-26 — End: 2021-12-26

## 2021-10-26 MED ORDER — SEVELAMER CARBONATE 800 MG TABLET
800.0000 mg | ORAL_TABLET | Freq: Three times a day (TID) | ORAL | 3 refills | Status: DC
Start: 2021-10-26 — End: 2021-12-21

## 2021-10-26 MED ORDER — CHOLECALCIFEROL (VITAMIN D3) 1,250 MCG (50,000 UNIT) CAPSULE
50000.0000 [IU] | ORAL_CAPSULE | ORAL | 0 refills | Status: DC
Start: 2021-10-26 — End: 2021-12-21

## 2021-10-26 MED ORDER — SODIUM BICARBONATE 325 MG TABLET
325.0000 mg | ORAL_TABLET | Freq: Two times a day (BID) | ORAL | 0 refills | Status: DC
Start: 2021-10-26 — End: 2021-12-21

## 2021-10-26 NOTE — Progress Notes (Signed)
TELEMEDICINE DOCUMENTATION:    Patient Location:  MyChart video visit from prn nephrology     Patient/family aware of provider location:  yes  Patient/family consent for telemedicine:  yes  Examination observed and performed by:  Video/phone  Time spent 25 minute        [place]    Progress Note    Name: Stephanie Wright MRN:  [id number]   Date: 10/26/2021 Age: 88 y.o.          Nephrology Follow Up Visit        This is a tele visit was performed over the video phone.  Consent was obtained per patient and her daughter.  HPI: 88 y.o. female with past medical history of CKD 5.  Feeling better she is bedridden.  Edema is better.  Her family decided to stop all her medications.  Family mentioned that the patient is complaining of some nausea patient is confirmed that.  Patient also reported headache to me.    Date 10/26/2021 blood pressure was 194/82 temperature 97.6 pulse 73 respiratory rate 20, blood sugar was 124 O2 sat 98% on room air.    ROS:         Systematic review of 12 organ systems was negative except what mentioned in in the HPI.      OBJECTIVE:   BP (!) 194/82 (Site: Upper Extremity, Patient Position: Sitting)   Pulse 73   Ht 1.575 m (5\' 2" )   Wt 70.3 kg (155 lb)   BMI 28.35 kg/m     Physical exam was not done as this was a tele visit/phone call visit   Patient is awake.  patient is not pale.      LABORATORY DATA:   No results found for: BUN, CREATININE, BUNCRRATIO, GFR, SODIUM, POTASSIUM, CHLORIDE, CO2, ANIONGAP, CALCIUM, PHOSPHORUS, ALBUMIN, HGB, HCT, INTACTPTH, IRON, IRONBINDCAP, IRONSAT, FERRITIN, 25HYDVITD2D3, 25HYDVITD3    No results found for: MICROALBUMIN, TOTPROTCREAT, HA1C, URICACID     10/18/2021 hemoglobin 10.3, creatinine 3.0 GFR 14 glucose 116 carbon dioxide 18 calcium 8.7 vitamin-D 27 phosphorus 3.7 uric acid 8.7 ferritin 417 PTH 53  07/25/2021 hemoglobin 10.9 creatinine 4.0 GFR 10 sodium 146 albumin 3.3 chloride 110 glucose 129 BUN 42 potassium 3.8  carbon dioxide 22 vitamin-D 45.5  Iron level 22, binding capacity 158 low.  LDL 77 uric acid 7.7 magnesium 2.3  05/24/2021 hemoglobin 9.2 platelets 219 sodium 149, potassium 3.2 GFR 12 creatinine 3.47 BUN 28 glucose 137 A1c 7.2 carbon dioxide 27 calcium 8.7 vitamin-D 27.9 uric acid 8.5    MEDICATIONS:  Outpatient Medications Marked as Taking for the 10/26/21 encounter (Telephone Visit) with Beather Arbour, MD   Medication Sig    amLODIPine (NORVASC) 5 mg Oral Tablet Take 1 Tablet (5 mg total) by mouth Once a day for 360 days Indications: high blood pressure    cholecalciferol, vitamin D3, 1,250 mcg (50,000 unit) Oral Capsule Take 1 Capsule (50,000 Units total) by mouth Every 7 days    ondansetron (ZOFRAN) 4 mg Oral Tablet Take 1 Tablet (4 mg total) by mouth Every 8 hours as needed for Nausea/Vomiting for up to 12 doses    sevelamer carbonate (RENVELA) 800 mg Oral Tablet Take 1 Tablet (800 mg total) by mouth Three times daily with meals for 30 days    sodium bicarbonate 325 mg Oral Tablet Take 1 Tablet (325 mg total) by mouth Twice daily for 30 days     Current Outpatient Medications  Medication Instructions    amLODIPine (NORVASC) 5 mg, Oral, DAILY    calcitrioL (ROCALTROL) 0.25 mcg, Oral, DAILY    cetirizine (ZYRTEC) 10 mg, Oral, DAILY    cholecalciferol (vitamin D3) 50,000 Units, Oral, EVERY 7 DAYS    cyanocobalamin (VITAMIN B 12) 1,000 mcg, Oral, DAILY    dicyclomine (BENTYL) 20 mg Oral Tablet dicyclomine 20 mg tablet    diphenhydramine (BENADRYL) 1-0.1 % Cream Topical, 3 TIMES DAILY PRN    ferrous sulfate (FEOSOL) 325 mg, Oral, 3 TIMES DAILY WITH MEALS    furosemide (LASIX) 20 mg, Oral, 2 TIMES DAILY    hydrALAZINE (APRESOLINE) 25 mg, Oral, 3 TIMES DAILY    levETIRAcetam (KEPPRA) 500 mg, Oral, EVERY 12 HOURS    magnesium oxide (MAG-OX) 400 mg, Oral, 2 TIMES DAILY    memantine (NAMENDA) 10 mg, Oral, 2 TIMES DAILY    nystatin (MYCOSTATIN) 100,000 unit/gram Cream Apply Topically, 2 TIMES DAILY    olmesartan (BENICAR)  40 mg, Oral, DAILY    ondansetron (ZOFRAN) 4 mg, Oral, EVERY 8 HOURS PRN    sevelamer carbonate (RENVELA) 800 mg, Oral, 3 TIMES DAILY WITH MEALS    SITagliptin phosphate (JANUVIA) 25 mg, Oral, DAILY    sodium bicarbonate 325 mg, Oral, 2 TIMES DAILY         ASSESSMENT / PLAN:   ENCOUNTER DIAGNOSES     ICD-10-CM   1. CKD (chronic kidney disease) stage 5, GFR less than 15 ml/min (CMS HCC)  N18.5   2. Type 2 diabetes mellitus (CMS HCC)  E11.9   3. Essential (primary) hypertension  I10                   Plan  Chronic kidney disease  -Stage 5  -Due to diabetes and hypertension  -Creatinine is at baseline. Patient does not have uremic symptoms.  -Baseline creatinine 3.4.  Creatinine is 3.0 condition is better  -Total protein to creatinine ratio: 4.0 g/gram  -CBC and a basic metabolic panel  -Return to clinic in 3 months  -Continue low-sodium diet  -Fluid restriction less than 40 ounces a day  -Avoid NSAIDs  -family does not want to proceed with dialysis.       Anemia   -iron levels reviewed.  - no need EPO     CKD bone mineral disease  - PTH  -Vitamin D level, replace  -Calcitriol plus vitamin D supplements  -Hyperphosphatemia continue Renvela 800 3 times daily, will check the levels        Hypertension  -Blood pressure is elevated  -Goal less than 130/80  -family stopped all BP meds instructed them to start amlodipine.  5 mg daily and blood pressure now better than do twice a day.  -Low-salt diet       Diastolic heart failure grade 1  -Continue Lasix 20 twice daily.  Refill was provided     Hyperphosphatemia  -On Renvela      Time spent 25  min, telephone call over the facetime pts' daughter was present in the clinic here and the pt was at home.  Pt and her daughter gave a verbal consent for the tele visit     Orders Placed This Encounter    CBC/DIFF    BASIC METABOLIC PANEL    OEV0J (HEMOGLOBIN A1C WITH EST AVG GLUCOSE)    sevelamer carbonate (RENVELA) 800 mg Oral Tablet    cholecalciferol, vitamin D3, 1,250 mcg (50,000  unit) Oral Capsule    sodium bicarbonate  325 mg Oral Tablet    ondansetron (ZOFRAN) 4 mg Oral Tablet    amLODIPine (NORVASC) 5 mg Oral Tablet

## 2021-12-21 ENCOUNTER — Other Ambulatory Visit: Payer: Self-pay

## 2021-12-21 ENCOUNTER — Encounter (RURAL_HEALTH_CENTER): Payer: Self-pay | Admitting: INTERNAL MEDICINE

## 2021-12-21 ENCOUNTER — Ambulatory Visit (RURAL_HEALTH_CENTER): Payer: Self-pay | Admitting: INTERNAL MEDICINE

## 2021-12-21 ENCOUNTER — Ambulatory Visit (RURAL_HEALTH_CENTER): Payer: Commercial Managed Care - PPO | Attending: INTERNAL MEDICINE | Admitting: INTERNAL MEDICINE

## 2021-12-21 VITALS — BP 190/81 | HR 68 | Temp 97.1°F | Resp 20

## 2021-12-21 DIAGNOSIS — Z7984 Long term (current) use of oral hypoglycemic drugs: Secondary | ICD-10-CM | POA: Insufficient documentation

## 2021-12-21 DIAGNOSIS — E559 Vitamin D deficiency, unspecified: Secondary | ICD-10-CM | POA: Insufficient documentation

## 2021-12-21 DIAGNOSIS — E1122 Type 2 diabetes mellitus with diabetic chronic kidney disease: Secondary | ICD-10-CM | POA: Insufficient documentation

## 2021-12-21 DIAGNOSIS — E876 Hypokalemia: Secondary | ICD-10-CM | POA: Insufficient documentation

## 2021-12-21 DIAGNOSIS — E785 Hyperlipidemia, unspecified: Secondary | ICD-10-CM | POA: Insufficient documentation

## 2021-12-21 DIAGNOSIS — D631 Anemia in chronic kidney disease: Secondary | ICD-10-CM | POA: Insufficient documentation

## 2021-12-21 DIAGNOSIS — E1169 Type 2 diabetes mellitus with other specified complication: Secondary | ICD-10-CM | POA: Insufficient documentation

## 2021-12-21 DIAGNOSIS — I10 Essential (primary) hypertension: Secondary | ICD-10-CM | POA: Insufficient documentation

## 2021-12-21 DIAGNOSIS — R54 Age-related physical debility: Secondary | ICD-10-CM | POA: Insufficient documentation

## 2021-12-21 DIAGNOSIS — D508 Other iron deficiency anemias: Secondary | ICD-10-CM | POA: Insufficient documentation

## 2021-12-21 DIAGNOSIS — R32 Unspecified urinary incontinence: Secondary | ICD-10-CM | POA: Insufficient documentation

## 2021-12-21 DIAGNOSIS — I5032 Chronic diastolic (congestive) heart failure: Secondary | ICD-10-CM | POA: Insufficient documentation

## 2021-12-21 DIAGNOSIS — I132 Hypertensive heart and chronic kidney disease with heart failure and with stage 5 chronic kidney disease, or end stage renal disease: Secondary | ICD-10-CM | POA: Insufficient documentation

## 2021-12-21 DIAGNOSIS — N185 Chronic kidney disease, stage 5: Secondary | ICD-10-CM | POA: Insufficient documentation

## 2021-12-21 NOTE — Progress Notes (Signed)
[facility]  [address]  [place], [place]  [address]  Phone: [phone] Fax: [phone]    Name: Stephanie Wright                       Date of Birth: [birth year]   MRN:  [id number]                         Date of visit: 12/21/2021     Chief Complaint: Follow Up (No new issues)    Current Outpatient Medications   Medication Sig    amLODIPine (NORVASC) 5 mg Oral Tablet Take 1 Tablet (5 mg total) by mouth Once a day for 360 days Indications: high blood pressure    calcitrioL (ROCALTROL) 0.25 mcg Oral Capsule Take 1 Capsule (0.25 mcg total) by mouth Once a day (Patient not taking: Reported on 12/21/2021)    cetirizine (ZYRTEC) 10 mg Oral Tablet Take 1 Tablet (10 mg total) by mouth Once a day for 180 days (Patient not taking: Reported on 12/21/2021)    cholecalciferol, vitamin D3, 1,250 mcg (50,000 unit) Oral Capsule Take 1 Capsule (50,000 Units total) by mouth Every 7 days (Patient not taking: Reported on 12/21/2021)    cyanocobalamin (VITAMIN B 12) 1,000 mcg Oral Tablet Take 1 Tablet (1,000 mcg total) by mouth Once a day for 180 days (Patient not taking: Reported on 12/21/2021)    dicyclomine (BENTYL) 20 mg Oral Tablet dicyclomine 20 mg tablet (Patient not taking: Reported on 12/21/2021)    diphenhydramine (BENADRYL) 1-0.1 % Cream Apply topically Three times a day as needed for up to 14 days    furosemide (LASIX) 20 mg Oral Tablet Take 1 Tablet (20 mg total) by mouth Twice daily for 180 days (Patient not taking: Reported on 12/21/2021)    hydrALAZINE (APRESOLINE) 25 mg Oral Tablet Take 1 Tablet (25 mg total) by mouth Three times a day (Patient not taking: Reported on 12/21/2021)    levETIRAcetam (KEPPRA) 500 mg Oral Tablet Take 1 Tablet (500 mg total) by mouth Every 12 hours for 180 days    nystatin (MYCOSTATIN) 100,000 unit/gram Cream Apply topically Twice daily (Patient not taking: Reported on 12/21/2021)    olmesartan (BENICAR) 40 mg Oral Tablet Take 1 Tablet (40 mg total) by mouth Once a day for 180 days     ondansetron (ZOFRAN) 4 mg Oral Tablet Take 1 Tablet (4 mg total) by mouth Every 8 hours as needed for Nausea/Vomiting for up to 12 doses (Patient not taking: Reported on 12/21/2021)    SITagliptin phosphate (JANUVIA) 25 mg Oral Tablet Take 1 Tablet (25 mg total) by mouth Once a day for 180 days       Patient Active Problem List    Diagnosis Date Noted    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/21/2021    Urinary incontinence 09/06/2021    Hyperlipidemia 06/07/2021    Benign hypertension 06/07/2021    Iron deficiency anemia secondary to inadequate dietary iron intake 06/07/2021    Chronic diastolic CHF (congestive heart failure) (CMS HCC) 06/07/2021    Hypoxia 06/07/2021    CKD (chronic kidney disease) stage 5, GFR less than 15 ml/min (CMS HCC) 05/20/2021    Vitamin D deficiency 05/20/2021    Anemia in chronic kidney disease (CKD) 05/20/2021    Hypomagnesemia 05/20/2021    Hypokalemia 05/20/2021    Type 2 diabetes mellitus (CMS [place]) 05/20/2021       Subjective:   Patient is here for  CDM visit. On phone with daughter.     The patient/family initiated a request for telephone service.  Verbal consent for this service was obtained from the patient/family.    Family requested telehealth visit due to difficulty in getting patient out of home. Daughter provided vital signs.   Patient was evaluated via video chat.   Entirety of history and information updated from daughter.     All meds have been stopped except amlodipine.     Eating baby food and ensure. When she is having better days, they are providing blended food.   Is having some trouble swallowing meds on some days.     Chronic pain  Doing fair at best with Tylenol  Should avoid ibuprofen due to renal function  Was on opiates previously, but had several inconsistent urine drug screens.  Patient has a family members with known dependence and abuse issues.  All controlled medications were discontinued    Diabetes mellitus  was on metformin, but stopped 2/2 renal dysfunction  Was also taking  Januvia  Patient admits to noncompliance  Refused medication adjustment  Labs on 07-10-2019 showed hemoglobin A1c 7.6  Labs on 01-15-2020 showed hemoglobin A1c 6.8  Labs on 12-06-2020 showed hemoglobin A1c 8.9  Labs on 07/26/2021 showed hemoglobin A1c 6.7    Hyperlipidemia  Refuses medication  Labs on 07-10-2019 showed total cholesterol 255, triglycerides 170, HDL 70, direct LDL 143  Labs on 01-15-2020 showed total cholesterol 150, triglycerides 154, HDL 81, direct LDL 135  Labs on 12-06-2020 showed total cholesterol 181, triglycerides 213, HDL 52, calculated LDL 86  Labs on 07/26/2021 showed total cholesterol 142, triglycerides 126, HDL 41, calculated LDL 78    Hypertension  On amlodipine  Was on olmesartan, hydralazine  Dr Marcille Blanco said we could stop amlodipine 2/2 edema, but BP elevated    Iron deficiency anemia  Doing well with replacement  Labs on 07-10-2019 showed hemoglobin 12.5, normocytic  Labs on 01-15-2020 showed hemoglobin 10.8, normocytic, normal iron studies  Labs on 12-31-2021 showed hemoglobin 11.2, normocytic  03-02-21 9.2  Labs on 07/26/2021 showed hemoglobin 10.9    Edema  Probably combination of CKD and iatrogenic  Doing well with Lasix    Muscle pain  no longer using quinine    Allergic rhinitis  Was on zyrtec    Hypomagnesemia  Suspect iatrogenic from Lasix  Doing well with replacement  Labs on 07/26/2021 showed magnesium 2.3    Hypokalemia  Suspect iatrogenic from Lasix  Was on potassium replacement  Labs on 07/26/2021 showed potassium 3.8    CDCHF  Was following with Dr Rayburn Ma [phone]    CKD  Stage 4-5  Improving after CHF flare 07-2020  Following with Dr Monia Pouch  Patient/Family do not want Dialysis. Patient is very high risk anyway.   [id number] Worsened again  02-2021 worsened again  05-2021 improved. gfr 12  07-2021 slightly worse. GFR 10. Nephro suggesting Hospice. Family not ready.     Gait failure  Difficulty staying upright.  1-2 person assist depending on the day.  Very unsteady  with moving.  Needs to use a cane or a walker when ambulating, however patient typically refuses.  Primarily relies on walls, furniture, and family members.  Or uses transport chair when leaving home. Had tried and failed physical therapy.  03-2019 Has not really improved since her discharge.  06-2019 Still has minimal improvement. This appears to be her new baseline.  10-2019 worse after Covid  03-2020 having some  improvement.  Close to what it was about this time last year  06-2020 Plateaued. Essentially the same as it was in early 2021.   * Seeing Dr Bosie Helper. Scheduled for imaging. Also scheduled for outpatient therapy.  08-2020 stopped PT as was not showing much benefit per patient    Hypoxia  O2 sats now usually running in 90s some desats at night.  using and benefitting from O2    Dementia  Multifactorial  Waxing and waning    ROS:  10 systems reviewed and were negative except as noted.   UTO 2/2 mentation and hearing loss    Objective :  BP (!) 190/81 (Site: Left, Patient Position: Supine, Cuff Size: Adult)   Pulse 68   Temp 36.2 C (97.1 F) (Temporal)   Resp 20   SpO2 98%       General:  appears chronically ill, vital signs reviewed, and more awake, alert, oriented  Neurologic:  confused  Psychiatric:  confused  Left facial droop  Patient has difficulty hearing on phone    Data reviewed:  Nephrology note  Daughter brought labs from [facility].     Assessment/Plan:  Assessment/Plan   1. Benign hypertension    2. Hyperlipidemia, unspecified hyperlipidemia type    3. Type 2 diabetes mellitus with other specified complication, without long-term current use of insulin (CMS HCC)    4. Chronic diastolic CHF (congestive heart failure) (CMS HCC)    5. Anemia in stage 5 chronic kidney disease, not on chronic dialysis (CMS HCC)     6. CKD (chronic kidney disease) stage 5, GFR less than 15 ml/min (CMS HCC)    7. Urinary incontinence, unspecified type    8. Vitamin D deficiency    9. Iron deficiency anemia secondary to  inadequate dietary iron intake    10. Hypokalemia    11. Hypomagnesemia    12. Senile debility        Chronic pain  Continue over-the-counter treatment    CDCHF  continue fluid restriction    CKD  Follow with Nephro    Seizure  Suspect metabolic etiology  follow with neuro    Vitamin D Deficiency/Renal hyperparathyroidism  monitor    Diabetes  Monitor    Hypertension  Continue amlodipine    Hyperlipidemia  Monitor    Iron deficiency anemia  Continue replacement    Edema  Continue Lasix    Hypomagnesemia/hypokalemia  Continue tablet replacement    Myalgia  monitor    Allergic rhinitis  monitor    Unsteady gait  Follow with Neuro    Urinary incontinence/Fecal Incontince  Diapers and pads.    Counseling has been provided.     20 minutes spent with patient. Audio and video visit.     Casimer Lanius, DO, [facility]  Internal Medicine

## 2021-12-21 NOTE — Nursing Note (Signed)
Patient is for her video visit.

## 2021-12-26 ENCOUNTER — Other Ambulatory Visit (INDEPENDENT_AMBULATORY_CARE_PROVIDER_SITE_OTHER): Payer: Self-pay | Admitting: Nephrology

## 2021-12-26 MED ORDER — AMLODIPINE 5 MG TABLET
5.0000 mg | ORAL_TABLET | Freq: Two times a day (BID) | ORAL | 3 refills | Status: DC
Start: 2021-12-26 — End: 2022-01-19

## 2021-12-26 NOTE — Telephone Encounter (Signed)
Sent amlodipine 5mg  BID to [facility] on 12/26/21 at 11:40 am per Dr. Liliane Shi

## 2022-01-19 ENCOUNTER — Other Ambulatory Visit (RURAL_HEALTH_CENTER): Payer: Self-pay | Admitting: INTERNAL MEDICINE

## 2022-01-19 ENCOUNTER — Telehealth (RURAL_HEALTH_CENTER): Payer: Self-pay | Admitting: INTERNAL MEDICINE

## 2022-01-19 MED ORDER — AMLODIPINE 5 MG TABLET
5.0000 mg | ORAL_TABLET | Freq: Two times a day (BID) | ORAL | 1 refills | Status: DC
Start: 2022-01-19 — End: 2022-04-24

## 2022-01-19 NOTE — Telephone Encounter (Signed)
Apolonio Schneiders, patient's daughter is requesting refill on Amlodipine.

## 2022-03-02 ENCOUNTER — Other Ambulatory Visit: Payer: Self-pay

## 2022-03-02 ENCOUNTER — Encounter (INDEPENDENT_AMBULATORY_CARE_PROVIDER_SITE_OTHER): Payer: Self-pay | Admitting: Nephrology

## 2022-03-02 ENCOUNTER — Telehealth: Payer: Commercial Managed Care - PPO | Admitting: Nephrology

## 2022-03-02 VITALS — BP 183/77 | HR 64 | Temp 98.7°F | Ht 62.0 in

## 2022-03-02 DIAGNOSIS — I5030 Unspecified diastolic (congestive) heart failure: Secondary | ICD-10-CM

## 2022-03-02 DIAGNOSIS — D649 Anemia, unspecified: Secondary | ICD-10-CM

## 2022-03-02 DIAGNOSIS — E559 Vitamin D deficiency, unspecified: Secondary | ICD-10-CM

## 2022-03-02 DIAGNOSIS — I132 Hypertensive heart and chronic kidney disease with heart failure and with stage 5 chronic kidney disease, or end stage renal disease: Secondary | ICD-10-CM

## 2022-03-02 DIAGNOSIS — E119 Type 2 diabetes mellitus without complications: Secondary | ICD-10-CM

## 2022-03-02 DIAGNOSIS — N185 Chronic kidney disease, stage 5: Secondary | ICD-10-CM

## 2022-03-02 DIAGNOSIS — E1122 Type 2 diabetes mellitus with diabetic chronic kidney disease: Secondary | ICD-10-CM

## 2022-03-02 NOTE — Progress Notes (Signed)
TELEMEDICINE DOCUMENTATION:    Patient Location:  MyChart Telephone visit from prn nephrology     Patient/family aware of provider location:  yes  Patient/family consent for telemedicine:  yes  Examination observed and performed by:  Video/phone  Time spent 25 minute        [place], [place]    Progress Note    Name: Luwana Butrick MRN:  [id number]   Date: 03/02/2022 Age: 89 y.o.          Nephrology Follow Up Visit       This is a tele visit was performed over the video phone.  Consent was obtained per patient and her daughter.  HPI: 89 y.o. female with past medical history of CKD 5.  Feeling better she is bedridden.  Edema is better.      Her family decided to stop all her medications.  Family mentioned that the patient is feeling well..  No edema        ROS:       Systematic review of 12 organ systems was negative except what mentioned in in the HPI.      OBJECTIVE:   BP (!) 183/77   Pulse 64   Temp 37.1 C (98.7 F)   Ht 1.575 m (5\' 2" )   BMI 28.35 kg/m     Physical exam was not done as this was a tele visit/phone call visit   Patient is awake.  patient is not pale.    LABORATORY DATA:   No results found for: "BUN", "CREATININE", "BUNCRRATIO", "GFR", "SODIUM", "POTASSIUM", "CHLORIDE", "CO2", "ANIONGAP", "CALCIUM", "PHOSPHORUS", "ALBUMIN", "HGB", "HCT", "INTACTPTH", "IRON", "IRONBINDCAP", "IRONSAT", "FERRITIN", "25HYDVITD2D3", "25HYDVITD3"    No results found for: "MICROALBUMIN", "TOTPROTCREAT", "HA1C", "URICACID"     02/23/2022 GFR 16, creatinine 2.77, potassium 3.3.  A1c 5.6, hemoglobin 9.2  10/18/2021 hemoglobin 10.3, creatinine 3.0 GFR 14 glucose 116 carbon dioxide 18 calcium 8.7 vitamin-D 27 phosphorus 3.7 uric acid 8.7 ferritin 417 PTH 53  07/25/2021 hemoglobin 10.9 creatinine 4.0 GFR 10 sodium 146 albumin 3.3 chloride 110 glucose 129 BUN 42 potassium 3.8 carbon dioxide 22 vitamin-D 45.5  Iron level 22, binding capacity 158 low.  LDL 77 uric acid 7.7 magnesium 2.3  05/24/2021  hemoglobin 9.2 platelets 219 sodium 149, potassium 3.2 GFR 12 creatinine 3.47 BUN 28 glucose 137 A1c 7.2 carbon dioxide 27 calcium 8.7 vitamin-D 27.9 uric acid 8.5    MEDICATIONS:  No outpatient medications have been marked as taking for the 03/02/22 encounter (Telephone Visit) with Beather Arbour, MD.     Current Outpatient Medications   Medication Instructions    amLODIPine (NORVASC) 5 mg, Oral, 2 TIMES DAILY         ASSESSMENT / PLAN:   ENCOUNTER DIAGNOSES     ICD-10-CM   1. CKD (chronic kidney disease) stage 5, GFR less than 15 ml/min (CMS HCC)  N18.5   2. Vitamin D deficiency  E55.9   3. Type 2 diabetes mellitus (CMS HCC)  E11.9                     Plan  Chronic kidney disease  -Stage 5  -Due to diabetes and hypertension  -Creatinine is at baseline. Patient does not have uremic symptoms.  -Baseline creatinine 3.4.  Creatinine is 3.0 --> 2.77 condition is better  -Total protein to creatinine ratio: 4.0 g/gram  -CBC and a basic metabolic panel  -Return to clinic in 3 months  -Continue low-sodium diet  -  Fluid restriction less than 40 ounces a day  -Avoid NSAIDs  -family does not want to proceed with dialysis.       Anemia   -we will monitor   - no need EPO     CKD bone mineral disease  - PTH  -Vitamin D level, replace  -Calcitriol plus vitamin D supplements  -Hyperphosphatemia continue Renvela 800 3 times daily, will check the levels        Hypertension  -Blood pressure is elevated  -Goal less than 130/80  -family stopped all BP meds instructed them to start amlodipine.  5 mg daily and blood pressure now better than do twice a day.  -Low-salt diet       Diastolic heart failure grade 1  -no edema      Hyperphosphatemia  -off  Renvela      Time spent 25  min, telephone call over the phone pts' daughter was present   Pt and her daughter gave a verbal consent for the tele visit     Orders Placed This Encounter    BASIC METABOLIC PANEL    CBC/DIFF    MAGNESIUM    MICROALBUMIN/CREATININE RATIO, URINE, RANDOM    PARATHYROID  HORMONE (PTH)    PROTEIN/CREATININE RATIO, URINE, RANDOM    URIC ACID    VITAMIN D 25 TOTAL    HGA1C (HEMOGLOBIN A1C WITH EST AVG GLUCOSE)

## 2022-04-24 ENCOUNTER — Encounter (RURAL_HEALTH_CENTER): Payer: Self-pay | Admitting: INTERNAL MEDICINE

## 2022-04-24 ENCOUNTER — Ambulatory Visit (RURAL_HEALTH_CENTER): Payer: Commercial Managed Care - PPO | Attending: INTERNAL MEDICINE | Admitting: INTERNAL MEDICINE

## 2022-04-24 ENCOUNTER — Other Ambulatory Visit: Payer: Self-pay

## 2022-04-24 VITALS — BP 191/86 | HR 74 | Temp 98.3°F | Resp 15

## 2022-04-24 DIAGNOSIS — E559 Vitamin D deficiency, unspecified: Secondary | ICD-10-CM | POA: Insufficient documentation

## 2022-04-24 DIAGNOSIS — D508 Other iron deficiency anemias: Secondary | ICD-10-CM | POA: Insufficient documentation

## 2022-04-24 DIAGNOSIS — Z79899 Other long term (current) drug therapy: Secondary | ICD-10-CM | POA: Insufficient documentation

## 2022-04-24 DIAGNOSIS — N185 Chronic kidney disease, stage 5: Secondary | ICD-10-CM | POA: Insufficient documentation

## 2022-04-24 DIAGNOSIS — Z7401 Bed confinement status: Secondary | ICD-10-CM | POA: Insufficient documentation

## 2022-04-24 DIAGNOSIS — E1122 Type 2 diabetes mellitus with diabetic chronic kidney disease: Secondary | ICD-10-CM | POA: Insufficient documentation

## 2022-04-24 DIAGNOSIS — E1169 Type 2 diabetes mellitus with other specified complication: Secondary | ICD-10-CM | POA: Insufficient documentation

## 2022-04-24 DIAGNOSIS — F0390 Unspecified dementia without behavioral disturbance: Secondary | ICD-10-CM | POA: Insufficient documentation

## 2022-04-24 DIAGNOSIS — D631 Anemia in chronic kidney disease: Secondary | ICD-10-CM | POA: Insufficient documentation

## 2022-04-24 DIAGNOSIS — E876 Hypokalemia: Secondary | ICD-10-CM | POA: Insufficient documentation

## 2022-04-24 DIAGNOSIS — I10 Essential (primary) hypertension: Secondary | ICD-10-CM | POA: Insufficient documentation

## 2022-04-24 DIAGNOSIS — I132 Hypertensive heart and chronic kidney disease with heart failure and with stage 5 chronic kidney disease, or end stage renal disease: Secondary | ICD-10-CM | POA: Insufficient documentation

## 2022-04-24 DIAGNOSIS — N2581 Secondary hyperparathyroidism of renal origin: Secondary | ICD-10-CM | POA: Insufficient documentation

## 2022-04-24 DIAGNOSIS — N3946 Mixed incontinence: Secondary | ICD-10-CM | POA: Insufficient documentation

## 2022-04-24 DIAGNOSIS — E782 Mixed hyperlipidemia: Secondary | ICD-10-CM | POA: Insufficient documentation

## 2022-04-24 DIAGNOSIS — R54 Age-related physical debility: Secondary | ICD-10-CM | POA: Insufficient documentation

## 2022-04-24 DIAGNOSIS — I5032 Chronic diastolic (congestive) heart failure: Secondary | ICD-10-CM | POA: Insufficient documentation

## 2022-04-24 MED ORDER — CYANOCOBALAMIN (VIT B-12) 1,000 MCG/ML INJECTION SOLUTION
1000.0000 ug | INTRAMUSCULAR | 1 refills | Status: DC
Start: 2022-04-24 — End: 2022-09-05

## 2022-04-24 MED ORDER — DOXYCYCLINE HYCLATE 100 MG CAPSULE
100.0000 mg | ORAL_CAPSULE | Freq: Two times a day (BID) | ORAL | 0 refills | Status: AC
Start: 2022-04-24 — End: 2022-05-04

## 2022-04-24 MED ORDER — AMLODIPINE 5 MG TABLET
5.0000 mg | ORAL_TABLET | Freq: Two times a day (BID) | ORAL | 1 refills | Status: DC
Start: 2022-04-24 — End: 2022-04-24

## 2022-04-24 MED ORDER — AMLODIPINE 5 MG TABLET
5.0000 mg | ORAL_TABLET | Freq: Two times a day (BID) | ORAL | 1 refills | Status: DC
Start: 2022-04-24 — End: 2022-09-05

## 2022-04-24 NOTE — Nursing Note (Signed)
Patient is for telehealth visit. Patient's daughter is on the phone for the patient. Rachael thinks the patient may have had a stroke because she has trouble swallowing and talking.

## 2022-04-24 NOTE — Progress Notes (Signed)
[facility]  [address]  [place], [place]  [address]  Phone: [phone] Fax: [phone]    Name: Cristene Reckers                       Date of Birth: [birth year]   MRN:  [id number]                         Date of visit: 04/24/2022     Chief Complaint: Follow Up 3 Months (Rachael( daughter) on the phone )    Current Outpatient Medications   Medication Sig    amLODIPine (NORVASC) 5 mg Oral Tablet Take 1 Tablet (5 mg total) by mouth Twice daily for 180 days Indications: high blood pressure    cyanocobalamin (VITAMIN B12) 1,000 mcg/mL Injection Solution Inject 1 mL (1,000 mcg total) under the skin Every 30 days    doxycycline hyclate (VIBRAMYCIN) 100 mg Oral Capsule Take 1 Capsule (100 mg total) by mouth Twice daily for 10 days       Patient Active Problem List    Diagnosis Date Noted    Secondary hyperparathyroidism (CMS [place]) 04/24/2022    Dementia (CMS [place]) 04/24/2022    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/21/2021    Urinary incontinence 09/06/2021    Hyperlipidemia 06/07/2021    Benign hypertension 06/07/2021    Iron deficiency anemia secondary to inadequate dietary iron intake 06/07/2021    Chronic diastolic CHF (congestive heart failure) (CMS HCC) 06/07/2021    Hypoxia 06/07/2021    CKD (chronic kidney disease) stage 5, GFR less than 15 ml/min (CMS HCC) 05/20/2021    Vitamin D deficiency 05/20/2021    Anemia in chronic kidney disease (CKD) 05/20/2021    Hypomagnesemia 05/20/2021    Hypokalemia 05/20/2021    Type 2 diabetes mellitus (CMS [place]) 05/20/2021       Subjective:   Patient is here for CDM visit. On phone with daughter.     The patient/family initiated a request for telephone service.  Verbal consent for this service was obtained from the patient/family.    Family requested telehealth visit due to difficulty in getting patient out of home. Daughter provided vital signs.   Entirety of history and information updated from daughter.   Still urinating well and having Bms.     All meds have been stopped except amlodipine  at family request.     Eating baby food and ensure. When she is having better days, they are providing blended food. Mostly drinking water.   Is having more difficulty swallowing. Not eating as much.     Chronic pain  Doing fair at best with Tylenol  Should avoid ibuprofen due to renal function  Was on opiates previously, but had several inconsistent urine drug screens.  Patient has family members with known dependence and abuse issues.  All controlled medications were discontinued    Diabetes mellitus  was on metformin, but stopped 2/2 renal dysfunction  Was also taking Januvia  Patient admits to noncompliance  Refused medication adjustment  Labs on 07-10-2019 showed hemoglobin A1c 7.6  Labs on 01-15-2020 showed hemoglobin A1c 6.8  Labs on 12-06-2020 showed hemoglobin A1c 8.9  Labs on 07/26/2021 showed hemoglobin A1c 6.7  Labs on 02/23/2022 showed hemoglobin A1c 5.6    Hyperlipidemia  Refuses medication  Labs on 07-10-2019 showed total cholesterol 255, triglycerides 170, HDL 70, direct LDL 143  Labs on 01-15-2020 showed total cholesterol 150, triglycerides 154, HDL 81, direct LDL  135  Labs on 12-06-2020 showed total cholesterol 181, triglycerides 213, HDL 52, calculated LDL 86  Labs on 07/26/2021 showed total cholesterol 142, triglycerides 126, HDL 41, calculated LDL 78    Hypertension  On amlodipine  Was on olmesartan, hydralazine  Dr Marcille Blanco said we could stop amlodipine 2/2 edema, but BP elevated    Iron deficiency anemia  Doing well with replacement  Labs on 07-10-2019 showed hemoglobin 12.5, normocytic  Labs on 01-15-2020 showed hemoglobin 10.8, normocytic, normal iron studies  Labs on 12-31-2021 showed hemoglobin 11.2, normocytic  03-02-21 9.2  Labs on 07/26/2021 showed hemoglobin 10.9  Labs on 02/23/2022 showed hemoglobin 9.2    Edema  Probably combination of CKD and iatrogenic  Previously on lasix    Muscle pain  no longer using quinine    Allergic rhinitis  Was on zyrtec    Hypomagnesemia  Suspect  iatrogenic from Lasix  Doing well with replacement  Labs on 07/26/2021 showed magnesium 2.3    Hypokalemia  Suspect iatrogenic from Lasix  Was on potassium replacement  Labs on 07/26/2021 showed potassium 3.8    CDCHF  Was following with Dr Rayburn Ma [phone]    CKD  Stage 5  Improving after CHF flare 07-2020  Following with Dr Monia Pouch  Patient/Family do not want Dialysis. Patient is very high risk anyway.   [id number] Worsened again  02-2021 worsened again  05-2021 improved. gfr 12  07-2021 slightly worse. GFR 10. Nephro suggesting Hospice. Family not ready.   Labs on 02/23/2022 showed BUN 27, creatinine 2.77, GFR 16    Gait failure  Difficulty staying upright.  1-2 person assist depending on the day.  Very unsteady with moving.  Needs to use a cane or a walker when ambulating, however patient typically refuses.  Primarily relies on walls, furniture, and family members.  Or uses transport chair when leaving home. Had tried and failed physical therapy.  03-2019 Has not really improved since her discharge.  06-2019 Still has minimal improvement. This appears to be her new baseline.  10-2019 worse after Covid  03-2020 having some improvement.  Close to what it was about this time last year  06-2020 Plateaued. Essentially the same as it was in early 2021.   * Seeing Dr Bosie Helper. Scheduled for imaging. Also scheduled for outpatient therapy.  08-2020 stopped PT as was not showing much benefit per patient    Hypoxia  O2 sats now usually running in 90s some desats at night.  using and benefitting from O2    Dementia  Multifactorial  Waxing and waning    ROS:  10 systems reviewed and were negative except as noted.     Objective :  BP (!) 191/86 (Site: Right, Patient Position: Supine, Cuff Size: Adult)   Pulse 74   Temp 36.8 C (98.3 F) (Oral)   Resp 15       General:  appears chronically ill, vital signs reviewed, and more awake, alert, oriented  Neurologic:  confused  Psychiatric:  confused  Very difficult to understand  today.     Data reviewed:  Nephrology note  Nephrology labs    Assessment/Plan:  Assessment/Plan   1. Benign hypertension    2. Mixed hyperlipidemia    3. Type 2 diabetes mellitus with other specified complication, without long-term current use of insulin (CMS HCC)    4. Chronic diastolic CHF (congestive heart failure) (CMS HCC)    5. CKD (chronic kidney disease) stage 5, GFR less than 15 ml/min (  CMS HCC)    6. Anemia in stage 5 chronic kidney disease, not on chronic dialysis (CMS HCC)     7. Mixed stress and urge urinary incontinence    8. Vitamin D deficiency    9. Iron deficiency anemia secondary to inadequate dietary iron intake    10. Senile debility    11. Hypokalemia    12. Hypomagnesemia    13. Secondary hyperparathyroidism (CMS HCC)    14. Dementia, unspecified dementia severity, unspecified dementia type, unspecified whether behavioral, psychotic, or mood disturbance or anxiety (CMS HCC)      Chronic pain  Continue over-the-counter treatment    CDCHF  continue fluid restriction    CKD  Follow with Nephro    Seizure  Suspect metabolic etiology  follow with neuro    Vitamin D Deficiency/Renal hyperparathyroidism  monitor    Diabetes  Monitor    Hypertension  Continue amlodipine    Hyperlipidemia  Monitor    Iron deficiency anemia  Monitor    Edema  Monitor    Hypomagnesemia/hypokalemia  Continue tablet replacement    Myalgia  monitor    Allergic rhinitis  monitor    Unsteady gait  Bed bound at this point    Dementia  Worsening    Urinary incontinence/Fecal Incontince  Undergarments and pads.    Resume B12 at family request.     Abx since having some rhonchi.     Counseling has been provided.     20 minutes spent with patient. Tele health visit. Audio only.     Casimer Lanius, DO, [facility]  Internal Medicine

## 2022-05-11 ENCOUNTER — Other Ambulatory Visit (RURAL_HEALTH_CENTER): Payer: Self-pay | Admitting: INTERNAL MEDICINE

## 2022-05-11 ENCOUNTER — Telehealth (RURAL_HEALTH_CENTER): Payer: Self-pay | Admitting: INTERNAL MEDICINE

## 2022-05-11 DIAGNOSIS — E1169 Type 2 diabetes mellitus with other specified complication: Secondary | ICD-10-CM

## 2022-05-11 DIAGNOSIS — N2581 Secondary hyperparathyroidism of renal origin: Secondary | ICD-10-CM

## 2022-05-11 DIAGNOSIS — I10 Essential (primary) hypertension: Secondary | ICD-10-CM

## 2022-05-11 DIAGNOSIS — E782 Mixed hyperlipidemia: Secondary | ICD-10-CM

## 2022-05-11 DIAGNOSIS — N185 Chronic kidney disease, stage 5: Secondary | ICD-10-CM

## 2022-05-11 DIAGNOSIS — E559 Vitamin D deficiency, unspecified: Secondary | ICD-10-CM

## 2022-05-11 NOTE — Telephone Encounter (Signed)
Patient was notified.

## 2022-05-11 NOTE — Telephone Encounter (Signed)
Apolonio Schneiders called wanting to know if you can order labs for the patient.  She said that she has not had any since January.

## 2022-05-11 NOTE — Telephone Encounter (Signed)
Printed  I even added most of what [facility] usually wants so those will be in there as well

## 2022-05-15 ENCOUNTER — Telehealth (RURAL_HEALTH_CENTER): Payer: Self-pay | Admitting: Family

## 2022-05-15 NOTE — Telephone Encounter (Signed)
Patient's daughter was notified.

## 2022-05-15 NOTE — Telephone Encounter (Signed)
Labs reviewed.  Let them know that overall everything looks stable in comparison to previous results.  Her chloride is a little up but this is most likely related to her hydration status and kidney function.  Continue to follow recommendations from nephrology.  Notify if any new symptoms or concerns.

## 2022-05-16 ENCOUNTER — Other Ambulatory Visit (RURAL_HEALTH_CENTER): Payer: Self-pay

## 2022-05-22 ENCOUNTER — Other Ambulatory Visit (RURAL_HEALTH_CENTER): Payer: Self-pay | Admitting: INTERNAL MEDICINE

## 2022-05-22 DIAGNOSIS — N185 Chronic kidney disease, stage 5: Secondary | ICD-10-CM

## 2022-07-06 ENCOUNTER — Encounter (INDEPENDENT_AMBULATORY_CARE_PROVIDER_SITE_OTHER): Payer: Self-pay | Admitting: Nephrology

## 2022-08-28 ENCOUNTER — Ambulatory Visit (RURAL_HEALTH_CENTER): Payer: Self-pay | Admitting: INTERNAL MEDICINE

## 2022-08-30 ENCOUNTER — Other Ambulatory Visit (RURAL_HEALTH_CENTER): Payer: Self-pay | Admitting: INTERNAL MEDICINE

## 2022-08-30 DIAGNOSIS — E785 Hyperlipidemia, unspecified: Secondary | ICD-10-CM

## 2022-08-30 DIAGNOSIS — N185 Chronic kidney disease, stage 5: Secondary | ICD-10-CM

## 2022-08-30 DIAGNOSIS — I10 Essential (primary) hypertension: Secondary | ICD-10-CM

## 2022-08-30 DIAGNOSIS — E1169 Type 2 diabetes mellitus with other specified complication: Secondary | ICD-10-CM

## 2022-08-30 DIAGNOSIS — E782 Mixed hyperlipidemia: Secondary | ICD-10-CM

## 2022-08-30 DIAGNOSIS — N2581 Secondary hyperparathyroidism of renal origin: Secondary | ICD-10-CM

## 2022-08-30 DIAGNOSIS — E119 Type 2 diabetes mellitus without complications: Secondary | ICD-10-CM

## 2022-08-30 DIAGNOSIS — E559 Vitamin D deficiency, unspecified: Secondary | ICD-10-CM

## 2022-09-05 ENCOUNTER — Encounter (RURAL_HEALTH_CENTER): Payer: Self-pay | Admitting: INTERNAL MEDICINE

## 2022-09-05 ENCOUNTER — Other Ambulatory Visit: Payer: Self-pay

## 2022-09-05 ENCOUNTER — Ambulatory Visit (RURAL_HEALTH_CENTER): Payer: Commercial Managed Care - PPO | Attending: INTERNAL MEDICINE | Admitting: INTERNAL MEDICINE

## 2022-09-05 VITALS — BP 130/55 | HR 68

## 2022-09-05 DIAGNOSIS — E782 Mixed hyperlipidemia: Secondary | ICD-10-CM | POA: Insufficient documentation

## 2022-09-05 DIAGNOSIS — F0390 Unspecified dementia without behavioral disturbance: Secondary | ICD-10-CM | POA: Insufficient documentation

## 2022-09-05 DIAGNOSIS — D631 Anemia in chronic kidney disease: Secondary | ICD-10-CM | POA: Insufficient documentation

## 2022-09-05 DIAGNOSIS — E1169 Type 2 diabetes mellitus with other specified complication: Secondary | ICD-10-CM

## 2022-09-05 DIAGNOSIS — I132 Hypertensive heart and chronic kidney disease with heart failure and with stage 5 chronic kidney disease, or end stage renal disease: Secondary | ICD-10-CM | POA: Insufficient documentation

## 2022-09-05 DIAGNOSIS — N3946 Mixed incontinence: Secondary | ICD-10-CM

## 2022-09-05 DIAGNOSIS — N185 Chronic kidney disease, stage 5: Secondary | ICD-10-CM | POA: Insufficient documentation

## 2022-09-05 DIAGNOSIS — I10 Essential (primary) hypertension: Secondary | ICD-10-CM | POA: Insufficient documentation

## 2022-09-05 DIAGNOSIS — E876 Hypokalemia: Secondary | ICD-10-CM

## 2022-09-05 DIAGNOSIS — D508 Other iron deficiency anemias: Secondary | ICD-10-CM

## 2022-09-05 DIAGNOSIS — E1122 Type 2 diabetes mellitus with diabetic chronic kidney disease: Secondary | ICD-10-CM | POA: Insufficient documentation

## 2022-09-05 DIAGNOSIS — N2581 Secondary hyperparathyroidism of renal origin: Secondary | ICD-10-CM | POA: Insufficient documentation

## 2022-09-05 DIAGNOSIS — R54 Age-related physical debility: Secondary | ICD-10-CM

## 2022-09-05 DIAGNOSIS — E559 Vitamin D deficiency, unspecified: Secondary | ICD-10-CM

## 2022-09-05 DIAGNOSIS — R0902 Hypoxemia: Secondary | ICD-10-CM

## 2022-09-05 DIAGNOSIS — I5032 Chronic diastolic (congestive) heart failure: Secondary | ICD-10-CM | POA: Insufficient documentation

## 2022-09-05 MED ORDER — CYANOCOBALAMIN (VIT B-12) 1,000 MCG/ML INJECTION SOLUTION
1000.0000 ug | INTRAMUSCULAR | 1 refills | Status: AC
Start: 2022-09-05 — End: ?

## 2022-09-05 MED ORDER — AMLODIPINE 5 MG TABLET
5.0000 mg | ORAL_TABLET | Freq: Two times a day (BID) | ORAL | 1 refills | Status: AC
Start: 2022-09-05 — End: 2023-03-04

## 2022-09-05 NOTE — Progress Notes (Signed)
[facility]  [address]  [place], [place]  [address]  Phone: [phone] Fax: [phone]    Name: Stephanie Wright                       Date of Birth: 13-Nov-1927   MRN:  [id number]                         Date of visit: 09/05/2022     Chief Complaint: Follow Up 3 Months    Current Outpatient Medications   Medication Sig    amLODIPine (NORVASC) 5 mg Oral Tablet Take 1 Tablet (5 mg total) by mouth Twice daily for 180 days Indications: high blood pressure    cyanocobalamin (VITAMIN B12) 1,000 mcg/mL Injection Solution Inject 1 mL (1,000 mcg total) under the skin Every 30 days       Patient Active Problem List    Diagnosis Date Noted    Secondary hyperparathyroidism (CMS HCC) 04/24/2022    Dementia (CMS HCC) 04/24/2022    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/21/2021    Urinary incontinence 09/06/2021    Hyperlipidemia 06/07/2021    Benign hypertension 06/07/2021    Iron deficiency anemia secondary to inadequate dietary iron intake 06/07/2021    Chronic diastolic CHF (congestive heart failure) (CMS HCC) 06/07/2021    Hypoxia 06/07/2021    CKD (chronic kidney disease) stage 5, GFR less than 15 ml/min (CMS HCC) 05/20/2021    Vitamin D deficiency 05/20/2021    Anemia in chronic kidney disease (CKD) 05/20/2021    Hypomagnesemia 05/20/2021    Hypokalemia 05/20/2021    Type 2 diabetes mellitus (CMS HCC) 05/20/2021       Subjective:   Patient is here for CDM visit. Presents with daughter.     No longer following with any specialists. Family states that since they have decided not to have dialysis, there is little that nephro can offer at this time.  Too hard to get patient out of home for neuro visits.     Eating baby food and ensure. When she is having better days, they are providing blended food. Mostly drinking water.   Is having more difficulty swallowing. Not eating as much.     Chronic pain  Doing fair at best with Tylenol  Should avoid ibuprofen due to renal function  Was on opiates previously, but had several inconsistent urine  drug screens.  Patient has family members with known dependence and abuse issues.  All controlled medications were discontinued    Diabetes mellitus  was on metformin, but stopped 2/2 renal dysfunction  Was also taking Januvia  Had some compliance issues  Labs on 07-10-2019 showed hemoglobin A1c 7.6  Labs on 01-15-2020 showed hemoglobin A1c 6.8  Labs on 12-06-2020 showed hemoglobin A1c 8.9  Labs on 07/26/2021 showed hemoglobin A1c 6.7  Labs on 02/23/2022 showed hemoglobin A1c 5.6  Labs on 08/30/2022 showed hemoglobin A1c 5.1    Hyperlipidemia  Off medication  Labs on 07-10-2019 showed total cholesterol 255, triglycerides 170, HDL 70, direct LDL 143  Labs on 01-15-2020 showed total cholesterol 150, triglycerides 154, HDL 81, direct LDL 135  Labs on 12-06-2020 showed total cholesterol 181, triglycerides 213, HDL 52, calculated LDL 86  Labs on 07/26/2021 showed total cholesterol 142, triglycerides 126, HDL 41, calculated LDL 78  Labs on 08/30/2022 showed total cholesterol 156, triglycerides 95, HDL 30, direct LDL 108    Hypertension  On amlodipine  Was on olmesartan, hydralazine  Dr Alethia Berthold said we could stop amlodipine 2/2 edema, but BP remained elevated    Iron deficiency anemia  Doing well with replacement  Labs on 07-10-2019 showed hemoglobin 12.5, normocytic  Labs on 01-15-2020 showed hemoglobin 10.8, normocytic, normal iron studies  Labs on 12-31-2021 showed hemoglobin 11.2, normocytic  03-02-21 9.2  Labs on 07/26/2021 showed hemoglobin 10.9  Labs on 02/23/2022 showed hemoglobin 9.2  Labs on 08/30/2022 showed hemoglobin 9.0, microcytic    Edema  Probably combination of CKD and iatrogenic  Previously on lasix    Muscle pain  no longer using quinine    Allergic rhinitis  Was on zyrtec    Hypomagnesemia  Suspect iatrogenic from Lasix  Doing well with replacement  Labs on 07/26/2021 showed magnesium 2.3    Hypokalemia  Suspect iatrogenic from Lasix  Was on potassium replacement  Labs on 07/26/2021 showed potassium  3.8    CDCHF  Was following with Dr Zada Finders [phone]    CKD  Stage 5  Improving after CHF flare 07-2020  Following with Dr Richmond Campbell  Patient/Family do not want Dialysis. Patient is very high risk anyway.   [phone] Worsened again  02-2021 worsened again  05-2021 improved. gfr 12  07-2021 slightly worse. GFR 10. Nephro suggesting Hospice. Family not ready.   Labs on 02/23/2022 showed BUN 27, creatinine 2.77, GFR 16  Labs on 08/30/2022 showed BUN 37, creatinine 2.67, GFR 16.     Vitamin-D deficiency   Labs on 08/30/2022 showed vitamin-D 25.9, PTH 58    Elevated uric acid   Secondary kidney disease  Labs 08/30/2022 showed uric acid 9.3   No gout flares    Gait failure  Difficulty staying upright.  1-2 person assist depending on the day.  Very unsteady with moving.  Needs to use a cane or a walker when ambulating, however patient typically refuses.  Primarily relies on walls, furniture, and family members.  Or uses transport chair when leaving home. Had tried and failed physical therapy.  03-2019 Has not really improved since her discharge.  06-2019 Still has minimal improvement. This appears to be her new baseline.  10-2019 worse after Covid  03-2020 having some improvement.  Close to what it was about this time last year  06-2020 Plateaued. Essentially the same as it was in early 2021.   * Seeing Dr Aretha Parrot. Scheduled for imaging. Also scheduled for outpatient therapy.  08-2020 stopped PT as was not showing much benefit per patient    Hypoxia  O2 sats now usually running in 90s some desats at night.  using and benefitting from O2    Dementia  Multifactorial  Waxing and waning    ROS:  10 systems reviewed and were negative except as noted.     Objective :  BP (!) 130/55 (Site: Left Arm, Patient Position: Supine, Cuff Size: Adult)   Pulse 68   SpO2 99% Comment: 2lpm nasal cannula      General:  appears chronically ill, vital signs reviewed, and more awake, alert, oriented  Lungs:  Clear to auscultation bilaterally.    Cardiovascular:  regular rate and rhythm, S1, S2 normal, no murmur, click, rub or gallop  Neurologic:  confused  Psychiatric:  confused  Sleeping. Difficult to arouse.     Data reviewed:  Labs were ordered and reviewed. Results noted as above.     Assessment/Plan:  Assessment/Plan   1. Benign hypertension    2. Mixed hyperlipidemia    3. Type 2 diabetes mellitus  with other specified complication, without long-term current use of insulin (CMS HCC)    4. Dementia, unspecified dementia severity, unspecified dementia type, unspecified whether behavioral, psychotic, or mood disturbance or anxiety (CMS HCC)    5. CKD (chronic kidney disease) stage 5, GFR less than 15 ml/min (CMS HCC)    6. Anemia in stage 5 chronic kidney disease, not on chronic dialysis (CMS HCC)  (CMS HCC)    7. Hypoxia    8. Chronic diastolic CHF (congestive heart failure) (CMS HCC)    9. Mixed stress and urge urinary incontinence    10. Vitamin D deficiency    11. Secondary hyperparathyroidism (CMS HCC)    12. Iron deficiency anemia secondary to inadequate dietary iron intake    13. Hypokalemia    14. Senile debility    15. Hypomagnesemia        Chronic pain  Continue over-the-counter treatment    CDCHF  continue fluid restriction    CKD  Monitor    Seizure  Suspect metabolic etiology  Monitor    Vitamin D Deficiency/Renal hyperparathyroidism  monitor    Diabetes  Monitor    Hypertension  Continue amlodipine    Hyperlipidemia  Monitor    Iron deficiency anemia  Monitor    Edema  Monitor    Hypomagnesemia/hypokalemia  Continue tablet replacement    Myalgia  monitor    Allergic rhinitis  monitor    Unsteady gait  Bed bound at this point    Dementia  Worsening    Urinary incontinence/Fecal Incontince  Undergarments and pads.    Hyperchloremia  Increase free water    Terald Sleeper, DO, [facility]  Internal Medicine

## 2022-09-05 NOTE — Nursing Note (Signed)
Patient is here for her 3 month follow up. No new issues.

## 2022-09-12 ENCOUNTER — Other Ambulatory Visit: Payer: Self-pay

## 2022-09-12 ENCOUNTER — Emergency Department (HOSPITAL_COMMUNITY): Payer: Commercial Managed Care - PPO

## 2022-09-12 ENCOUNTER — Inpatient Hospital Stay
Admission: EM | Admit: 2022-09-12 | Discharge: 2022-10-15 | DRG: 871 | Disposition: E | Payer: Commercial Managed Care - PPO | Attending: Critical Care Medicine | Admitting: Critical Care Medicine

## 2022-09-12 ENCOUNTER — Encounter (HOSPITAL_COMMUNITY): Payer: Self-pay

## 2022-09-12 DIAGNOSIS — R6520 Severe sepsis without septic shock: Secondary | ICD-10-CM | POA: Diagnosis present

## 2022-09-12 DIAGNOSIS — E860 Dehydration: Secondary | ICD-10-CM | POA: Diagnosis present

## 2022-09-12 DIAGNOSIS — Z1152 Encounter for screening for COVID-19: Secondary | ICD-10-CM

## 2022-09-12 DIAGNOSIS — I132 Hypertensive heart and chronic kidney disease with heart failure and with stage 5 chronic kidney disease, or end stage renal disease: Secondary | ICD-10-CM | POA: Diagnosis present

## 2022-09-12 DIAGNOSIS — G928 Other toxic encephalopathy: Secondary | ICD-10-CM

## 2022-09-12 DIAGNOSIS — N185 Chronic kidney disease, stage 5: Secondary | ICD-10-CM | POA: Diagnosis present

## 2022-09-12 DIAGNOSIS — J9621 Acute and chronic respiratory failure with hypoxia: Secondary | ICD-10-CM | POA: Diagnosis present

## 2022-09-12 DIAGNOSIS — Z9981 Dependence on supplemental oxygen: Secondary | ICD-10-CM

## 2022-09-12 DIAGNOSIS — E1122 Type 2 diabetes mellitus with diabetic chronic kidney disease: Secondary | ICD-10-CM | POA: Diagnosis present

## 2022-09-12 DIAGNOSIS — G9341 Metabolic encephalopathy: Secondary | ICD-10-CM

## 2022-09-12 DIAGNOSIS — I5032 Chronic diastolic (congestive) heart failure: Secondary | ICD-10-CM | POA: Diagnosis present

## 2022-09-12 DIAGNOSIS — J189 Pneumonia, unspecified organism: Secondary | ICD-10-CM | POA: Diagnosis present

## 2022-09-12 DIAGNOSIS — R0902 Hypoxemia: Secondary | ICD-10-CM | POA: Diagnosis present

## 2022-09-12 DIAGNOSIS — J9601 Acute respiratory failure with hypoxia: Principal | ICD-10-CM

## 2022-09-12 DIAGNOSIS — D631 Anemia in chronic kidney disease: Secondary | ICD-10-CM | POA: Diagnosis present

## 2022-09-12 DIAGNOSIS — N179 Acute kidney failure, unspecified: Secondary | ICD-10-CM | POA: Diagnosis present

## 2022-09-12 DIAGNOSIS — Z66 Do not resuscitate: Secondary | ICD-10-CM | POA: Diagnosis present

## 2022-09-12 DIAGNOSIS — R531 Weakness: Secondary | ICD-10-CM

## 2022-09-12 DIAGNOSIS — E870 Hyperosmolality and hypernatremia: Secondary | ICD-10-CM | POA: Diagnosis present

## 2022-09-12 DIAGNOSIS — A419 Sepsis, unspecified organism: Principal | ICD-10-CM | POA: Diagnosis present

## 2022-09-12 LAB — COMPREHENSIVE METABOLIC PANEL, NON-FASTING
ALBUMIN/GLOBULIN RATIO: 1 (ref 0.8–1.4)
ALBUMIN: 2.7 g/dL — ABNORMAL LOW (ref 3.5–5.7)
ALKALINE PHOSPHATASE: 38 U/L (ref 34–104)
ALT (SGPT): 7 U/L (ref 7–52)
ANION GAP: 9 mmol/L (ref 4–13)
AST (SGOT): 10 U/L — ABNORMAL LOW (ref 13–39)
BILIRUBIN TOTAL: 0.5 mg/dL (ref 0.3–1.0)
BUN/CREA RATIO: 19 (ref 6–22)
BUN: 58 mg/dL — ABNORMAL HIGH (ref 7–25)
CALCIUM, CORRECTED: 9.5 mg/dL (ref 8.9–10.8)
CALCIUM: 8.5 mg/dL — ABNORMAL LOW (ref 8.6–10.3)
CHLORIDE: 128 mmol/L — ABNORMAL HIGH (ref 98–107)
CO2 TOTAL: 19 mmol/L — ABNORMAL LOW (ref 21–31)
CREATININE: 3.09 mg/dL — ABNORMAL HIGH (ref 0.60–1.30)
ESTIMATED GFR: 14 mL/min/{1.73_m2} — ABNORMAL LOW (ref >59)
GLOBULIN: 2.8 — ABNORMAL LOW (ref 2.9–5.4)
GLUCOSE: 139 mg/dL — ABNORMAL HIGH (ref 74–109)
OSMOLALITY, CALCULATED: 328 mOsm/kg — ABNORMAL HIGH (ref 270–290)
POTASSIUM: 4.7 mmol/L (ref 3.5–5.1)
PROTEIN TOTAL: 5.5 g/dL — ABNORMAL LOW (ref 6.4–8.9)
SODIUM: 156 mmol/L — ABNORMAL HIGH (ref 136–145)

## 2022-09-12 LAB — ARTERIAL BLOOD GAS/LACTATE
%FIO2 (ARTERIAL): 40 %
BASE DEFICIT: 7.8 mmol/L — ABNORMAL HIGH (ref 0.0–2.0)
BICARBONATE (ARTERIAL): 18.3 mmol/L — ABNORMAL LOW (ref 20.0–26.0)
CARBOXYHEMOGLOBIN: 1.2 % (ref <=1.5)
OXYHEMOGLOBIN: 93.2 % (ref 88.0–100.0)
PCO2 (ARTERIAL): 44 mm/Hg (ref 35–45)
PH (ARTERIAL): 7.26 — ABNORMAL LOW (ref 7.35–7.45)
PO2 (ARTERIAL): 70 mm/Hg — ABNORMAL LOW (ref 80–100)

## 2022-09-12 LAB — PT/INR
INR: 1.13 — ABNORMAL HIGH (ref 0.84–1.10)
PROTHROMBIN TIME: 13.2 seconds — ABNORMAL HIGH (ref 9.8–12.7)

## 2022-09-12 LAB — CBC WITH DIFF
BASOPHIL #: 0 10*3/uL (ref 0.00–0.10)
BASOPHIL %: 0 % (ref 0–1)
EOSINOPHIL #: 0 10*3/uL (ref 0.00–0.50)
EOSINOPHIL %: 0 % — ABNORMAL LOW (ref 1–7)
HCT: 34.2 % (ref 31.2–41.9)
HGB: 10.5 g/dL — ABNORMAL LOW (ref 10.9–14.3)
LYMPHOCYTE #: 1.6 10*3/uL (ref 1.00–3.00)
LYMPHOCYTE %: 11 % — ABNORMAL LOW (ref 16–44)
MCH: 28.2 pg (ref 24.7–32.8)
MCHC: 30.7 g/dL — ABNORMAL LOW (ref 32.3–35.6)
MCV: 91.9 fL (ref 75.5–95.3)
MONOCYTE #: 0.6 10*3/uL (ref 0.30–1.00)
MONOCYTE %: 4 % — ABNORMAL LOW (ref 5–13)
MPV: 9.4 fL (ref 7.9–10.8)
NEUTROPHIL #: 13.1 10*3/uL — ABNORMAL HIGH (ref 1.85–7.80)
NEUTROPHIL %: 85 % — ABNORMAL HIGH (ref 43–77)
PLATELETS: 194 10*3/uL (ref 140–440)
RBC: 3.73 10*6/uL (ref 3.63–4.92)
RDW: 19 % — ABNORMAL HIGH (ref 12.3–17.7)
WBC: 15.3 10*3/uL — ABNORMAL HIGH (ref 3.8–11.8)

## 2022-09-12 LAB — C-REACTIVE PROTEIN (CRP): C-REACTIVE PROTEIN (CRP): 1.5 mg/dL — ABNORMAL HIGH (ref 0.1–0.5)

## 2022-09-12 LAB — COVID-19, FLU A/B, RSV RAPID BY PCR
INFLUENZA VIRUS TYPE A: NOT DETECTED
INFLUENZA VIRUS TYPE B: NOT DETECTED
RESPIRATORY SYNCTIAL VIRUS (RSV): NOT DETECTED
SARS-CoV-2: NOT DETECTED

## 2022-09-12 LAB — TROPONIN-I: TROPONIN I: 337 ng/L — ABNORMAL HIGH (ref <15)

## 2022-09-12 LAB — THYROID STIMULATING HORMONE WITH FREE T4 REFLEX: TSH: 2.27 u[IU]/mL (ref 0.450–5.330)

## 2022-09-12 LAB — LACTIC ACID LEVEL W/ REFLEX FOR LEVEL >2.0: LACTIC ACID: 1.8 mmol/L (ref 0.5–2.2)

## 2022-09-12 LAB — PTT (PARTIAL THROMBOPLASTIN TIME): APTT: 27.7 seconds (ref 25.0–38.0)

## 2022-09-12 LAB — MAGNESIUM: MAGNESIUM: 2 mg/dL (ref 1.9–2.7)

## 2022-09-12 LAB — B-TYPE NATRIURETIC PEPTIDE (BNP),PLASMA: BNP: >5000 pg/mL — ABNORMAL HIGH (ref 1–100)

## 2022-09-12 LAB — AMMONIA: AMMONIA: 40 umol/L (ref 16–53)

## 2022-09-12 MED ORDER — LEVOFLOXACIN 500 MG/100 ML IN 5 % DEXTROSE INTRAVENOUS PIGGYBACK
INJECTION | INTRAVENOUS | Status: AC
Start: 2022-09-12 — End: 2022-09-12
  Filled 2022-09-12: qty 100

## 2022-09-12 MED ORDER — IPRATROPIUM 0.5 MG-ALBUTEROL 3 MG (2.5 MG BASE)/3 ML NEBULIZATION SOLN
3.0000 mL | INHALATION_SOLUTION | RESPIRATORY_TRACT | Status: AC
Start: 2022-09-12 — End: 2022-09-12
  Administered 2022-09-12: 3 mL via RESPIRATORY_TRACT

## 2022-09-12 MED ORDER — BUDESONIDE 0.5 MG/2 ML SUSPENSION FOR NEBULIZATION
0.5000 mg | INHALATION_SUSPENSION | Freq: Once | RESPIRATORY_TRACT | Status: AC
Start: 2022-09-12 — End: 2022-09-12
  Administered 2022-09-12: 0.5 mg via RESPIRATORY_TRACT

## 2022-09-12 MED ORDER — SODIUM CHLORIDE 0.9 % INTRAVENOUS SOLUTION
INTRAVENOUS | Status: DC
Start: 2022-09-12 — End: 2022-09-13
  Administered 2022-09-13: 0 mL via INTRAVENOUS

## 2022-09-12 MED ORDER — LEVOFLOXACIN 500 MG/100 ML IN 5 % DEXTROSE INTRAVENOUS PIGGYBACK
500.0000 mg | INJECTION | INTRAVENOUS | Status: AC
Start: 2022-09-12 — End: 2022-09-12
  Administered 2022-09-12: 500 mg via INTRAVENOUS
  Administered 2022-09-12: 0 mg via INTRAVENOUS

## 2022-09-12 MED ORDER — VANCOMYCIN 10 GRAM INTRAVENOUS SOLUTION
20.0000 mg/kg | INTRAVENOUS | Status: AC
Start: 2022-09-12 — End: 2022-09-13
  Administered 2022-09-12: 1000 mg via INTRAVENOUS
  Administered 2022-09-13: 0 mg via INTRAVENOUS
  Filled 2022-09-12: qty 10

## 2022-09-12 NOTE — ED Provider Notes (Addendum)
[facility]  Emergency Department  Attending Provider Note      CHIEF COMPLAINT  Chief Complaint   Patient presents with    Shortness of Breath    Altered Mental Status     HISTORY OF PRESENT ILLNESS  Stephanie Wright, date of birth 02-01-1928, is a 89 y.o. female who presented to the Emergency Department.    PATIENT PRESENTS EMERGENCY DEPARTMENT TODAY WITH INCREASING SHORTNESS OF BREATH OVER LAST WEEK AND ALTERED MENTAL STATUS OVER LAST FEW DAYS.  NOTHING REPORTED BETTER EXACERBATE THE MODERATE TO SEVERE CONDITIONING.  TYPICALLY WEARS 2 LITERS/MINUTE VIA NASAL CANNULA.  REQUIRING MORE CURRENTLY.  A COMPREHENSIVE 10+ REVIEW OF SYSTEMS IS OTHERWISE UNABLE TO BE COLLECTED SECONDARY TO THE ACUTE METABOLIC ENCEPHALOPATHY.  FAMILY PROVIDES THE HISTORY.  THEY STATE THE PATIENT IS A DNR.  NO OTHER ACUTE COMPLAINTS.    PAST MEDICAL/SURGICAL/FAMILY/SOCIAL HISTORY  Past Medical History:   Diagnosis Date    Allergic rhinitis     Arthropathy     Congestive heart failure (CMS HCC)     Diabetes mellitus, type 2 (CMS HCC)     Hypercholesterolemia     Hypertension     Renal failure        Past Surgical History:   Procedure Laterality Date    BREAST MASS EXCISION      HX CATARACT REMOVAL      HX ELBOW SURGERY      HX HYSTERECTOMY      HX LAP CHOLECYSTECTOMY      LAMINECTOMY      WRIST SURGERY         Family Medical History:    Family history is unknown by patient.       Social History     Socioeconomic History    Marital status: Widowed   Tobacco Use    Smoking status: Never    Smokeless tobacco: Never   Vaping Use    Vaping status: Never Used   Substance and Sexual Activity    Alcohol use: Never    Drug use: Never     Social Determinants of Health     Transportation Needs: Unknown (09/13/2022)    Transportation Needs     SDOH Transportation: Patient chooses not to answer.   Social Connections: Low Risk  (09/13/2022)    Social Connections     SDOH Social Isolation: 5 or more times a week      ALLERGIES  Allergies    Allergen Reactions    Aspirin     Cephalexin     Codeine     Methocarbamol     Penicillins        PHYSICAL EXAM  VITAL SIGNS:  Filed Vitals:    10-02-22 1300 10-02-2022 1330 Oct 02, 2022 1400 Oct 02, 2022 1519   BP: (!) 80/52 (!) 72/48 (!) 75/51    Pulse: (!) 136 (!) 136 79 (!) 0   Resp: (!) 35 (!) 34 (!) 34 (!) 0   Temp:       SpO2: 95% 95% 96%      GENERAL: PATIENT IS NOT AND LETHARGIC AND NOT ORIENTED TO PERSON, PLACE, AND TIME.  HEAD: NORMOCEPHALIC AND ATRAUMATIC.  EYES: PUPILS EQUALLY ROUND AND REACT TO LIGHT. EXTRAOCULAR MOVEMENTS UNABLE TO BE FULLY ASSESSED.  EARS: GROSS HEARING UNABLE TO BE FULLY ASSESSED. EXTERNAL EARS WITHIN NORMAL LIMITS.  NOSE: NO SEPTAL DEVIATION. NASAL PASSAGES CLEAR.  THROAT: MOIST ORAL MUCOSA.   NECK: SUPPLE. TRACHEA MIDLINE.  HEART: REGULAR, RATE, AND RHYTHM.  LUNGS:  DECREASED BILATERAL BREATH SOUNDS AND SCATTERED RHONCHI.  ABDOMEN: SOFT, NON-TENDER, NON-DISTENDED, AND BOWEL SOUNDS ARE PRESENT.  GENITOURINARY: DEFERRED.  RECTAL: DEFERRED.  EXTREMITIES: NO CYANOSIS AND CLUBBING.  SKIN: WARM AND DRY.  MUSCULOSKELETAL: DEFERRED.  NEUROLOGIC: CRANIAL NERVES II THROUGH XII AND SENSATION TO LIGHT TOUCH ARE UNABLE TO BE FULLY ASSESS.  PSYCHIATRIC: JUDGMENT AND INSIGHT ARE LACKING.    DIAGNOSTICS  Labs:  Labs listed below were reviewed and interpreted by me.  Results for orders placed or performed during the hospital encounter of Oct 05, 2022   ADULT ROUTINE BLOOD CULTURE, SET OF 2 ADULT BOTTLES (BACTERIA AND YEAST)    Specimen: Blood   Result Value Ref Range    BLOOD CULTURE, ROUTINE No Growth 5 Days    ADULT ROUTINE BLOOD CULTURE, SET OF 2 ADULT BOTTLES (BACTERIA AND YEAST)    Specimen: Blood   Result Value Ref Range    BLOOD CULTURE, ROUTINE No Growth 5 Days    TROPONIN-I   Result Value Ref Range    TROPONIN I 337 (H) <15 ng/L   THYROID STIMULATING HORMONE WITH FREE T4 REFLEX   Result Value Ref Range    TSH 2.270 0.450 - 5.330 uIU/mL   LACTIC ACID LEVEL W/ REFLEX FOR LEVEL >2.0   Result Value Ref  Range    LACTIC ACID 1.8 0.5 - 2.2 mmol/L   B-TYPE NATRIURETIC PEPTIDE (BNP),PLASMA   Result Value Ref Range    BNP >5,000 (H) 1 - 100 pg/mL   COMPREHENSIVE METABOLIC PANEL, NON-FASTING   Result Value Ref Range    SODIUM 156 (H) 136 - 145 mmol/L    POTASSIUM 4.7 3.5 - 5.1 mmol/L    CHLORIDE 128 (H) 98 - 107 mmol/L    CO2 TOTAL 19 (L) 21 - 31 mmol/L    ANION GAP 9 4 - 13 mmol/L    BUN 58 (H) 7 - 25 mg/dL    CREATININE 4.78 (H) 0.60 - 1.30 mg/dL    BUN/CREA RATIO 19 6 - 22    ESTIMATED GFR 14 (L) >59 mL/min/1.13m^2    ALBUMIN 2.7 (L) 3.5 - 5.7 g/dL    CALCIUM 8.5 (L) 8.6 - 10.3 mg/dL    GLUCOSE 295 (H) 74 - 109 mg/dL    ALKALINE PHOSPHATASE 38 34 - 104 U/L    ALT (SGPT) 7 7 - 52 U/L    AST (SGOT) 10 (L) 13 - 39 U/L    BILIRUBIN TOTAL 0.5 0.3 - 1.0 mg/dL    PROTEIN TOTAL 5.5 (L) 6.4 - 8.9 g/dL    ALBUMIN/GLOBULIN RATIO 1.0 0.8 - 1.4    OSMOLALITY, CALCULATED 328 (H) 270 - 290 mOsm/kg    CALCIUM, CORRECTED 9.5 8.9 - 10.8 mg/dL    GLOBULIN 2.8 (L) 2.9 - 5.4   COVID-19, FLU A/B, RSV RAPID BY PCR   Result Value Ref Range    SARS-CoV-2 Not Detected Not Detected    INFLUENZA VIRUS TYPE A Not Detected Not Detected    INFLUENZA VIRUS TYPE B Not Detected Not Detected    RESPIRATORY SYNCTIAL VIRUS (RSV) Not Detected Not Detected   C-REACTIVE PROTEIN (CRP)   Result Value Ref Range    C-REACTIVE PROTEIN (CRP) 1.5 (H) 0.1 - 0.5 mg/dL   AMMONIA   Result Value Ref Range    AMMONIA 40 16 - 53 umol/L   ARTERIAL BLOOD GAS/LACTATE   Result Value Ref Range    PH (ARTERIAL) 7.26 (L) 7.35 - 7.45    PCO2 (ARTERIAL)  44 35 - 45 mm/Hg    BICARBONATE (ARTERIAL) 18.3 (L) 20.0 - 26.0 mmol/L    MET-HEMOGLOBIN      LACTATE      CARBOXYHEMOGLOBIN 1.2 <=1.5 %    O2CT      %FIO2 (ARTERIAL) 40 %    PO2 (ARTERIAL) 70 (L) 80 - 100 mm/Hg    OXYHEMOGLOBIN 93.2 88.0 - 100.0 %    ALLEN TEST Y     DRAW SITE LRad     BASE DEFICIT 7.8 (H) 0.0 - 2.0 mmol/L   MAGNESIUM   Result Value Ref Range    MAGNESIUM 2.0 1.9 - 2.7 mg/dL   PTT (PARTIAL THROMBOPLASTIN TIME)    Result Value Ref Range    APTT 27.7 25.0 - 38.0 seconds   PT/INR   Result Value Ref Range    PROTHROMBIN TIME 13.2 (H) 9.8 - 12.7 seconds    INR 1.13 (H) 0.84 - 1.10   CBC WITH DIFF   Result Value Ref Range    WBC 15.3 (H) 3.8 - 11.8 x10^3/uL    RBC 3.73 3.63 - 4.92 x10^6/uL    HGB 10.5 (L) 10.9 - 14.3 g/dL    HCT 04.5 40.9 - 81.1 %    MCV 91.9 75.5 - 95.3 fL    MCH 28.2 24.7 - 32.8 pg    MCHC 30.7 (L) 32.3 - 35.6 g/dL    RDW 91.4 (H) 78.2 - 17.7 %    PLATELETS 194 140 - 440 x10^3/uL    MPV 9.4 7.9 - 10.8 fL    NEUTROPHIL % 85 (H) 43 - 77 %    LYMPHOCYTE % 11 (L) 16 - 44 %    MONOCYTE % 4 (L) 5 - 13 %    EOSINOPHIL % 0 (L) 1 - 7 %    BASOPHIL % 0 0 - 1 %    NEUTROPHIL # 13.10 (H) 1.85 - 7.80 x10^3/uL    LYMPHOCYTE # 1.60 1.00 - 3.00 x10^3/uL    MONOCYTE # 0.60 0.30 - 1.00 x10^3/uL    EOSINOPHIL # 0.00 0.00 - 0.50 x10^3/uL    BASOPHIL # 0.00 0.00 - 0.10 x10^3/uL   GOLD TOP TUBE   Result Value Ref Range    RAINBOW/EXTRA TUBE AUTO RESULT Yes    COMPREHENSIVE METABOLIC PANEL, NON-FASTING   Result Value Ref Range    SODIUM 155 (H) 136 - 145 mmol/L    POTASSIUM 4.4 3.5 - 5.1 mmol/L    CHLORIDE 128 (H) 98 - 107 mmol/L    CO2 TOTAL 15 (L) 21 - 31 mmol/L    ANION GAP 12 4 - 13 mmol/L    BUN 61 (H) 7 - 25 mg/dL    CREATININE 9.56 (H) 0.60 - 1.30 mg/dL    BUN/CREA RATIO 19 6 - 22    ESTIMATED GFR 13 (L) >59 mL/min/1.29m^2    ALBUMIN 2.5 (L) 3.5 - 5.7 g/dL    CALCIUM 8.3 (L) 8.6 - 10.3 mg/dL    GLUCOSE 213 (H) 74 - 109 mg/dL    ALKALINE PHOSPHATASE 34 34 - 104 U/L    ALT (SGPT) 7 7 - 52 U/L    AST (SGOT) 11 (L) 13 - 39 U/L    BILIRUBIN TOTAL 0.6 0.3 - 1.0 mg/dL    PROTEIN TOTAL 5.0 (L) 6.4 - 8.9 g/dL    ALBUMIN/GLOBULIN RATIO 1.0 0.8 - 1.4    OSMOLALITY, CALCULATED 330 (H) 270 - 290 mOsm/kg    CALCIUM, CORRECTED 9.5  8.9 - 10.8 mg/dL    GLOBULIN 2.5 (L) 2.9 - 5.4   MAGNESIUM   Result Value Ref Range    MAGNESIUM 1.8 (L) 1.9 - 2.7 mg/dL   CBC WITH DIFF   Result Value Ref Range    WBC 4.9 3.8 - 11.8 x10^3/uL    RBC 4.13 3.63 -  4.92 x10^6/uL    HGB 11.8 10.9 - 14.3 g/dL    HCT 16.1 09.6 - 04.5 %    MCV 94.7 75.5 - 95.3 fL    MCH 28.6 24.7 - 32.8 pg    MCHC 30.2 (L) 32.3 - 35.6 g/dL    RDW 40.9 (H) 81.1 - 17.7 %    PLATELETS 172 140 - 440 x10^3/uL    MPV 9.6 7.9 - 10.8 fL    NEUTROPHIL % 80 (H) 43 - 77 %    LYMPHOCYTE % 12 (L) 16 - 44 %    MONOCYTE % 8 5 - 13 %    EOSINOPHIL % 0 (L) 1 - 7 %    BASOPHIL % 0 0 - 1 %    NEUTROPHIL # 3.90 1.85 - 7.80 x10^3/uL    LYMPHOCYTE # 0.60 (L) 1.00 - 3.00 x10^3/uL    MONOCYTE # 0.40 0.30 - 1.00 x10^3/uL    EOSINOPHIL # 0.00 0.00 - 0.50 x10^3/uL    BASOPHIL # 0.00 0.00 - 0.10 x10^3/uL   VANCOMYCIN, TROUGH   Result Value Ref Range    VANCOMYCIN TROUGH 8.9 5.0 - 10.0 ug/mL   BASIC METABOLIC PANEL   Result Value Ref Range    SODIUM 154 (H) 136 - 145 mmol/L    POTASSIUM 4.2 3.5 - 5.1 mmol/L    CHLORIDE 120 (H) 98 - 107 mmol/L    CO2 TOTAL 14 (L) 21 - 31 mmol/L    ANION GAP 20 (H) 4 - 13 mmol/L    CALCIUM 9.0 8.6 - 10.3 mg/dL    GLUCOSE 914 (H) 74 - 109 mg/dL    BUN 65 (H) 7 - 25 mg/dL    CREATININE 7.82 (H) 0.60 - 1.30 mg/dL    BUN/CREA RATIO 19 6 - 22    ESTIMATED GFR 12 (L) >59 mL/min/1.50m^2    OSMOLALITY, CALCULATED 330 (H) 270 - 290 mOsm/kg   CBC   Result Value Ref Range    WBC 16.0 (H) 3.8 - 11.8 x10^3/uL    RBC 2.68 (L) 3.63 - 4.92 x10^6/uL    HGB 7.8 (L) 10.9 - 14.3 g/dL    HCT 95.6 (L) 21.3 - 41.9 %    MCV 90.7 75.5 - 95.3 fL    MCH 29.0 24.7 - 32.8 pg    MCHC 31.9 (L) 32.3 - 35.6 g/dL    RDW 08.6 (H) 57.8 - 17.7 %    PLATELETS 94 (L) 140 - 440 x10^3/uL    MPV 10.1 7.9 - 10.8 fL   MAGNESIUM - AM ONCE   Result Value Ref Range    MAGNESIUM 1.8 (L) 1.9 - 2.7 mg/dL   PHOSPHORUS - AM ONCE   Result Value Ref Range    PHOSPHORUS 2.9 (L) 3.7 - 7.2 mg/dL   MANUAL DIFFERENTIAL   Result Value Ref Range    WBC 16.0 x10^3/uL    NEUTROPHIL % 48 40 - 76 %    LYMPHOCYTE % 11 (L) 25 - 45 %    MONOCYTE % 1 0 - 12 %    EOSINOPHIL %      BASOPHIL %  METAMYELOCYTE % 4 %    MYELOCYTE %      PROMYELOCYTE %      BAND %  36 (H) 5 - 11 %    BLAST %      OTHER %      NEUTROPHIL ABSOLUTE 13.44 (H) 1.80 - 8.40 x10^3/uL    LYMPHOCYTE ABSOLUTE 1.76 1.10 - 5.00 x10^3/uL    MONOCYTE ABSOLUTE 0.16 0.00 - 1.30 x10^3/uL    EOSINOPHIL ABSOLUTE      BASOPHIL ABSOLUTE      METAMYELOCYTE ABSOLUTE 0.64 x10^3/uL    MYELOCYTE ABSOLUTE      PROMYELOCYTE ABSOLUTE      BLAST ABSOLUTE      OTHER CELL ABSOLUTE      ANISOCYTOSIS 1+ (10-25%)     POLYCHROMASIA      POIKILOCYTOSIS 2+ (50%)     BASOPHILIC STIPPLING      MICROCYTOSIS      MACROCYTOSIS      ROULEAUX      SCHISTOCYTES 1+ (10-25%)     SPHEROCYTES      TARGET CELLS      TEARDROP CELLS      OVALOCYTE (ELLIPTOCYTE) 1+ (10-25%)     CRENATED RED CELLS      STOMATOCYTES      ACANTHOCYTES (SPUR CELL) 1+ (10-25%)     ECHINOCYTE (BURR CELL)      BLISTER CELLS      RBC AGGLUTINATES      HOWELL JOLLY BODIES      ATYPICAL LYMPHOCYTES      TOXIC GRANULATION      DOHLE BODIES      TOXIC VACUOLIZATION      AUER RODS      BASKET CELLS      HYPERSEGMENTATION      LARGE PLATELETS Present     PLATELET CLUMPS      PLATELET MORPHOLOGY COMMENT Decreased     BANDS NEUTROPHILS MANUAL 36     BAND ABSOLUTE      NEUTROPHILS MANUAL 48     LYMPHOCYTES MANUAL 11     MONOCYTES MANUAL 1     EOSINOPHILS MANUAL      BASOPHILS MANUAL      PROMYELOCYTES MANUAL      MYELOCYTES MANUAL      METAMYELOCYTES MANUAL 4     BLASTS MANUAL      TOTAL CELLS COUNTED [#] IN BLOOD 100     OTHER CELLS MANUAL      NUCLEATED RBC MANUAL      PLASMA CELL %      PLASMA CELL ABSOLUE      PLASMA CELLS MANUAL      HYPOCHROMASIA     ECG 12 LEAD   Result Value Ref Range    Ventricular rate 106 BPM    Atrial Rate 106 BPM    PR Interval 138 ms    QRS Duration 92 ms    QT Interval 354 ms    QTC Calculation 470 ms    Calculated P Axis 91 degrees    Calculated R Axis 109 degrees    Calculated T Axis -62 degrees   POC BLOOD GLUCOSE (RESULTS)   Result Value Ref Range    GLUCOSE, POC 154 (H) 70 - 100 mg/dl   POC BLOOD GLUCOSE (RESULTS)   Result Value Ref Range     GLUCOSE, POC 189 (H) 70 - 100 mg/dl   POC BLOOD GLUCOSE (RESULTS)   Result Value Ref Range  GLUCOSE, POC 223 (H) 70 - 100 mg/dl   POC BLOOD GLUCOSE (RESULTS)   Result Value Ref Range    GLUCOSE, POC 223 (H) 70 - 100 mg/dl   POC BLOOD GLUCOSE (RESULTS)   Result Value Ref Range    GLUCOSE, POC 198 (H) 70 - 100 mg/dl   POC BLOOD GLUCOSE (RESULTS)   Result Value Ref Range    GLUCOSE, POC 225 (H) 70 - 100 mg/dl   POC BLOOD GLUCOSE (RESULTS)   Result Value Ref Range    GLUCOSE, POC 180 (H) 70 - 100 mg/dl   POC BLOOD GLUCOSE (RESULTS)   Result Value Ref Range    GLUCOSE, POC 151 (H) 70 - 100 mg/dl     Radiology:  Results for orders placed or performed during the hospital encounter of 09/02/2022   XR AP MOBILE CHEST     Status: None    Narrative    Zendayah R Noone    RADIOLOGIST: Elna Breslow, MD    XR AP MOBILE CHEST performed on 09/07/2022 9:54 PM    CLINICAL HISTORY: FEVER.  fever.    TECHNIQUE: Frontal view of the chest.    COMPARISON:  07/18/2020    FINDINGS:    Heart size is mildly enlarged.     Airspace disease in the right upper and right lower lobes. Increased density of the right hilum. Question right hilar mass. Atelectasis left lung base. No vascular congestion or pneumothorax.           Impression    Suspected pneumonia within the right upper and right lower lobes. Increased density in the right hilum. Question hilar mass or nodal enlargement. Recommend a short-term follow-up radiographs to ensure that these findings resolve. If they do not, would consider CT chest.        Radiologist location ID: [id number]     CT BRAIN WO IV CONTRAST     Status: None    Narrative    Brittony R Nakata    RADIOLOGIST: Elna Breslow, MD    CT BRAIN WO IV CONTRAST performed on 08/26/2022 11:02 PM    CLINICAL HISTORY: AMS.  ams    TECHNIQUE:  Head CT without intravenous contrast.    COMPARISON: 03/04/2004    FINDINGS:  There is no acute intracranial hemorrhage, mass effect, or evidence of large acute infarct.    Brain: There is  moderate to severe global volume loss. There is encephalomalacia within the right posterior frontal and parietal lobes and right temporal lobe and left cerebral hemisphere. These could be from old infarct.    Extensive hyperlucency throughout the periventricular and subcortical white matter compatible small vessel ischemic disease. Atherosclerotic vascular calcification.    CSF Spaces: Advanced generalized cerebral atrophy     Sinuses/Mastoids:  Clear at visualized levels     Bones: Unremarkable        Impression    CHRONIC CHANGES.  NO ACUTE FINDINGS.       One or more dose reduction techniques were used (e.g., Automated exposure control, adjustment of the mA and/or kV according to patient size, use of iterative reconstruction technique).      Radiologist location ID: [id number]         ED COURSE/MEDICAL DECISION MAKING  Medications Administered in the ED   ipratropium-albuterol 0.5 mg-3 mg(2.5 mg base)/3 mL Solution for Nebulization (3 mL Nebulization Given 09/09/2022 2215)   budesonide (PULMICORT RESPULES) 0.5 mg/2 mL nebulizer suspension (0.5 mg Nebulization Given 08/18/2022 2215)  vancomycin (VANCOCIN) 1,000 mg in NS 250 mL IVPB (has no administration in time range)   levoFLOXacin (LEVAQUIN) 500 mg in D5W 100 mL premix IVPB (500 mg Intravenous New Bag/New Syringe 08/18/2022 2215)      ED Course as of 10/03/22 0846   Tue Sep 12, 2022   2146 ECG 12 LEAD  TODAY I, Sayda Grable A. Isaia Hassell, D.O., PERSONALLY VISUALIZED THE PATIENT'S EKG TRACING.  I AM THE PRIMARY INTERPRETER.  NORMAL SINUS RHYTHM AT A RATE OF 106.  NO AV BLOCKS.  RIGHT AXIS DEVIATION.  BASELINE WANDERING WITH ARTIFACT.  T-WAVE INVERSIONS IN THE LATERAL AND INFERIOR LEADS.  OTHERWISE NONSPECIFIC ST-T CHANGES.  NO LVH.  NO BUNDLE-BRANCH BLOCKS.     2205 XR AP MOBILE CHEST  TODAY I, Zidane Renner A. Jacquelin Krajewski, D.O., PERSONALLY VISUALIZED THE PATIENT'S DIAGNOSTIC IMAGING STUDIES.  DEFER TO THE RADIOLOGIST'S REPORT FOR THE FINAL DEFINITIVE RADIOLOGY READING.  MY INITIAL  INDEPENDENT INTERPRETATION IS AS FOLLOWS, BUT NOT LIMITED TO:  PNEUMONIA     2206 WBC(!): 15.3  ELEVATED   2214 aPTT: 27.7  WNL   2214 INR(!): 1.13  SLIGHTLY ELEVATED      Medical Decision Making  PATIENT WILL NOT BE RECEIVING A 30 CC/KG FLUID BOLUS DUE TO AKI ON CKD AND CHF WITH BNP OVER 5,000.    Problems Addressed:  Acute hypoxemic respiratory failure (CMS HCC): acute illness or injury  Acute metabolic encephalopathy: acute illness or injury  Generalized weakness: acute illness or injury  Pneumonia: acute illness or injury  Sepsis, due to unspecified organism, unspecified whether acute organ dysfunction present (CMS [facility]): acute illness or injury    Amount and/or Complexity of Data Reviewed  Independent Historian:      Details: FAMILY  Labs: ordered. Decision-making details documented in ED Course.  Radiology: ordered and independent interpretation performed. Decision-making details documented in ED Course.  ECG/medicine tests: ordered and independent interpretation performed. Decision-making details documented in ED Course.    Risk  Prescription drug management.  Drug therapy requiring intensive monitoring for toxicity.  Decision regarding hospitalization.  Diagnosis or treatment significantly limited by social determinants of health.    Critical Care  Total time providing critical care: 35 minutes      CLINICAL IMPRESSION  Clinical Impression   Acute hypoxemic respiratory failure (CMS HCC) (Primary)   Acute metabolic encephalopathy   Generalized weakness   Pneumonia   Sepsis, due to unspecified organism, unspecified whether acute organ dysfunction present (CMS HCC)   Acute kidney injury (CMS [facility])     DISPOSITION  Admitted       DISCHARGE MEDICATIONS  Discharge Medication List as of 10-02-22  6:50 PM          /Neville Pauls A. Earlene Plater, DO, MBA   09/01/2022, 22:31   [facility]  Department of Emergency Medicine  [facility]    This note was partially generated using MModal Fluency Direct  system, and there may be some incorrect words, spellings, and punctuation that were not noted in checking the note before saving.

## 2022-09-12 NOTE — ED Triage Notes (Addendum)
Brought to ED via [facility] with c/o SOB, AMS x2 days, worse today.     PTA via EMS: 20g IV right forearm, duoneb, 02 @ 3L/min via NC per home use. NS infusing

## 2022-09-13 ENCOUNTER — Encounter (HOSPITAL_COMMUNITY): Payer: Self-pay | Admitting: Emergency Medicine

## 2022-09-13 DIAGNOSIS — Z885 Allergy status to narcotic agent status: Secondary | ICD-10-CM

## 2022-09-13 DIAGNOSIS — R0902 Hypoxemia: Secondary | ICD-10-CM

## 2022-09-13 DIAGNOSIS — I132 Hypertensive heart and chronic kidney disease with heart failure and with stage 5 chronic kidney disease, or end stage renal disease: Secondary | ICD-10-CM

## 2022-09-13 DIAGNOSIS — N185 Chronic kidney disease, stage 5: Secondary | ICD-10-CM

## 2022-09-13 DIAGNOSIS — E1122 Type 2 diabetes mellitus with diabetic chronic kidney disease: Secondary | ICD-10-CM

## 2022-09-13 DIAGNOSIS — J189 Pneumonia, unspecified organism: Secondary | ICD-10-CM | POA: Diagnosis present

## 2022-09-13 DIAGNOSIS — Z886 Allergy status to analgesic agent status: Secondary | ICD-10-CM

## 2022-09-13 DIAGNOSIS — I509 Heart failure, unspecified: Secondary | ICD-10-CM

## 2022-09-13 DIAGNOSIS — A419 Sepsis, unspecified organism: Secondary | ICD-10-CM | POA: Diagnosis present

## 2022-09-13 DIAGNOSIS — Z880 Allergy status to penicillin: Secondary | ICD-10-CM

## 2022-09-13 DIAGNOSIS — R9431 Abnormal electrocardiogram [ECG] [EKG]: Secondary | ICD-10-CM

## 2022-09-13 DIAGNOSIS — R4182 Altered mental status, unspecified: Secondary | ICD-10-CM

## 2022-09-13 DIAGNOSIS — R000 Tachycardia, unspecified: Secondary | ICD-10-CM

## 2022-09-13 LAB — COMPREHENSIVE METABOLIC PANEL, NON-FASTING
ALBUMIN/GLOBULIN RATIO: 1 (ref 0.8–1.4)
ALBUMIN: 2.5 g/dL — ABNORMAL LOW (ref 3.5–5.7)
ALKALINE PHOSPHATASE: 34 U/L (ref 34–104)
ALT (SGPT): 7 U/L (ref 7–52)
ANION GAP: 12 mmol/L (ref 4–13)
AST (SGOT): 11 U/L — ABNORMAL LOW (ref 13–39)
BILIRUBIN TOTAL: 0.6 mg/dL (ref 0.3–1.0)
BUN/CREA RATIO: 19 (ref 6–22)
BUN: 61 mg/dL — ABNORMAL HIGH (ref 7–25)
CALCIUM, CORRECTED: 9.5 mg/dL (ref 8.9–10.8)
CALCIUM: 8.3 mg/dL — ABNORMAL LOW (ref 8.6–10.3)
CHLORIDE: 128 mmol/L — ABNORMAL HIGH (ref 98–107)
CO2 TOTAL: 15 mmol/L — ABNORMAL LOW (ref 21–31)
CREATININE: 3.23 mg/dL — ABNORMAL HIGH (ref 0.60–1.30)
ESTIMATED GFR: 13 mL/min/{1.73_m2} — ABNORMAL LOW (ref >59)
GLOBULIN: 2.5 — ABNORMAL LOW (ref 2.9–5.4)
GLUCOSE: 196 mg/dL — ABNORMAL HIGH (ref 74–109)
OSMOLALITY, CALCULATED: 330 mOsm/kg — ABNORMAL HIGH (ref 270–290)
POTASSIUM: 4.4 mmol/L (ref 3.5–5.1)
PROTEIN TOTAL: 5 g/dL — ABNORMAL LOW (ref 6.4–8.9)
SODIUM: 155 mmol/L — ABNORMAL HIGH (ref 136–145)

## 2022-09-13 LAB — POC BLOOD GLUCOSE (RESULTS)
GLUCOSE, POC: 154 mg/dl — ABNORMAL HIGH (ref 70–100)
GLUCOSE, POC: 189 mg/dl — ABNORMAL HIGH (ref 70–100)
GLUCOSE, POC: 223 mg/dl — ABNORMAL HIGH (ref 70–100)
GLUCOSE, POC: 223 mg/dl — ABNORMAL HIGH (ref 70–100)
GLUCOSE, POC: 198 mg/dl — ABNORMAL HIGH (ref 70–100)
GLUCOSE, POC: 225 mg/dl — ABNORMAL HIGH (ref 70–100)

## 2022-09-13 LAB — ECG 12 LEAD
Atrial Rate: 106 {beats}/min
Calculated P Axis: 91 degrees
Calculated R Axis: 109 degrees
Calculated T Axis: -62 degrees
PR Interval: 138 ms
QRS Duration: 92 ms
QT Interval: 354 ms
QTC Calculation: 470 ms
Ventricular rate: 106 {beats}/min

## 2022-09-13 LAB — CBC WITH DIFF
BASOPHIL #: 0 10*3/uL (ref 0.00–0.10)
BASOPHIL %: 0 % (ref 0–1)
EOSINOPHIL #: 0 10*3/uL (ref 0.00–0.50)
EOSINOPHIL %: 0 % — ABNORMAL LOW (ref 1–7)
HCT: 39.1 % (ref 31.2–41.9)
HGB: 11.8 g/dL (ref 10.9–14.3)
LYMPHOCYTE #: 0.6 10*3/uL — ABNORMAL LOW (ref 1.00–3.00)
LYMPHOCYTE %: 12 % — ABNORMAL LOW (ref 16–44)
MCH: 28.6 pg (ref 24.7–32.8)
MCHC: 30.2 g/dL — ABNORMAL LOW (ref 32.3–35.6)
MCV: 94.7 fL (ref 75.5–95.3)
MONOCYTE #: 0.4 10*3/uL (ref 0.30–1.00)
MONOCYTE %: 8 % (ref 5–13)
MPV: 9.6 fL (ref 7.9–10.8)
NEUTROPHIL #: 3.9 10*3/uL (ref 1.85–7.80)
NEUTROPHIL %: 80 % — ABNORMAL HIGH (ref 43–77)
PLATELETS: 172 10*3/uL (ref 140–440)
RBC: 4.13 10*6/uL (ref 3.63–4.92)
RDW: 19.7 % — ABNORMAL HIGH (ref 12.3–17.7)
WBC: 4.9 10*3/uL (ref 3.8–11.8)

## 2022-09-13 LAB — MAGNESIUM: MAGNESIUM: 1.8 mg/dL — ABNORMAL LOW (ref 1.9–2.7)

## 2022-09-13 LAB — GOLD TOP TUBE

## 2022-09-13 MED ORDER — HEPARIN (PORCINE) 5,000 UNIT/ML INJECTION SOLUTION
5000.0000 [IU] | Freq: Three times a day (TID) | INTRAMUSCULAR | Status: DC
Start: 2022-09-13 — End: 2022-09-14
  Administered 2022-09-13 – 2022-09-14 (×4): 5000 [IU] via SUBCUTANEOUS
  Filled 2022-09-13 (×4): qty 1

## 2022-09-13 MED ORDER — GLUCAGON 1 MG/ML SOLUTION FOR INJECTION
1.0000 mg | Freq: Once | INTRAMUSCULAR | Status: DC | PRN
Start: 2022-09-13 — End: 2022-09-14

## 2022-09-13 MED ORDER — ACETAMINOPHEN 325 MG TABLET
650.0000 mg | ORAL_TABLET | ORAL | Status: DC | PRN
Start: 2022-09-13 — End: 2022-09-14

## 2022-09-13 MED ORDER — ONDANSETRON HCL (PF) 4 MG/2 ML INJECTION SOLUTION
4.0000 mg | Freq: Four times a day (QID) | INTRAMUSCULAR | Status: DC | PRN
Start: 2022-09-13 — End: 2022-09-14

## 2022-09-13 MED ORDER — LACTATED RINGERS IV BOLUS
1000.0000 mL | INJECTION | Freq: Once | Status: AC
Start: 2022-09-13 — End: 2022-09-13
  Administered 2022-09-13: 0 mL via INTRAVENOUS
  Administered 2022-09-13: 1000 mL via INTRAVENOUS

## 2022-09-13 MED ORDER — INSULIN LISPRO 100 UNIT/ML SUB-Q SSIP VIAL
1.0000 [IU] | INJECTION | Freq: Four times a day (QID) | SUBCUTANEOUS | Status: DC
Start: 2022-09-13 — End: 2022-09-13
  Administered 2022-09-13 (×2): 0 [IU] via SUBCUTANEOUS
  Filled 2022-09-13: qty 3

## 2022-09-13 MED ORDER — DEXTROSE 50 % IN WATER (D50W) INTRAVENOUS SYRINGE
12.5000 g | INJECTION | INTRAVENOUS | Status: DC | PRN
Start: 2022-09-13 — End: 2022-09-14

## 2022-09-13 MED ORDER — IPRATROPIUM 0.5 MG-ALBUTEROL 3 MG (2.5 MG BASE)/3 ML NEBULIZATION SOLN
3.0000 mL | INHALATION_SOLUTION | RESPIRATORY_TRACT | Status: DC | PRN
Start: 2022-09-13 — End: 2022-09-14

## 2022-09-13 MED ORDER — DEXTROSE 40 % ORAL GEL
15.0000 g | ORAL | Status: DC | PRN
Start: 2022-09-13 — End: 2022-09-14

## 2022-09-13 MED ORDER — IPRATROPIUM 0.5 MG-ALBUTEROL 3 MG (2.5 MG BASE)/3 ML NEBULIZATION SOLN
3.0000 mL | INHALATION_SOLUTION | Freq: Four times a day (QID) | RESPIRATORY_TRACT | Status: DC
Start: 2022-09-13 — End: 2022-09-14
  Administered 2022-09-13 (×4): 3 mL via RESPIRATORY_TRACT
  Administered 2022-09-14: 0 mL via RESPIRATORY_TRACT
  Administered 2022-09-14: 3 mL via RESPIRATORY_TRACT
  Filled 2022-09-13: qty 3

## 2022-09-13 MED ORDER — METRONIDAZOLE 500 MG/100 ML IN SODIUM CHLOR(ISO) INTRAVENOUS PIGGYBACK
500.0000 mg | INJECTION | Freq: Two times a day (BID) | INTRAVENOUS | Status: DC
Start: 2022-09-13 — End: 2022-09-14
  Administered 2022-09-13: 500 mg via INTRAVENOUS
  Administered 2022-09-13: 0 mg via INTRAVENOUS
  Administered 2022-09-13: 500 mg via INTRAVENOUS
  Administered 2022-09-13: 0 mg via INTRAVENOUS
  Administered 2022-09-14: 500 mg via INTRAVENOUS
  Administered 2022-09-14: 0 mg via INTRAVENOUS
  Filled 2022-09-13 (×3): qty 100

## 2022-09-13 MED ORDER — SODIUM CHLORIDE 0.9 % INTRAVENOUS PIGGYBACK
100.0000 mg | Freq: Two times a day (BID) | INTRAVENOUS | Status: DC
Start: 2022-09-13 — End: 2022-09-13
  Administered 2022-09-13: 0 mg via INTRAVENOUS
  Administered 2022-09-13: 100 mg via INTRAVENOUS
  Filled 2022-09-13: qty 10

## 2022-09-13 MED ORDER — DEXTROSE 5 % AND 0.45 % SODIUM CHLORIDE INTRAVENOUS SOLUTION
INTRAVENOUS | Status: DC
Start: 2022-09-13 — End: 2022-09-13
  Administered 2022-09-13: 0 mL via INTRAVENOUS

## 2022-09-13 MED ORDER — ALBUMIN, HUMAN 25 % INTRAVENOUS SOLUTION
50.0000 g | Freq: Three times a day (TID) | INTRAVENOUS | Status: AC
Start: 2022-09-13 — End: 2022-09-14
  Administered 2022-09-13: 0 g via INTRAVENOUS
  Administered 2022-09-13 (×2): 50 g via INTRAVENOUS
  Administered 2022-09-13 – 2022-09-14 (×2): 0 g via INTRAVENOUS
  Administered 2022-09-14: 50 g via INTRAVENOUS
  Filled 2022-09-13: qty 200
  Filled 2022-09-13: qty 100
  Filled 2022-09-13: qty 200

## 2022-09-13 MED ORDER — LEVOFLOXACIN 500 MG/100 ML IN 5 % DEXTROSE INTRAVENOUS PIGGYBACK
500.0000 mg | INJECTION | INTRAVENOUS | Status: DC
Start: 2022-09-13 — End: 2022-09-14
  Administered 2022-09-13: 0 mg via INTRAVENOUS
  Administered 2022-09-13: 500 mg via INTRAVENOUS
  Filled 2022-09-13 (×2): qty 100

## 2022-09-13 MED ORDER — MORPHINE 2 MG/ML INJECTION WRAPPER
2.0000 mg | INJECTION | INTRAMUSCULAR | Status: DC | PRN
Start: 2022-09-13 — End: 2022-09-14
  Administered 2022-09-14: 2 mg via INTRAVENOUS
  Filled 2022-09-13: qty 1

## 2022-09-13 MED ORDER — LORAZEPAM 2 MG/ML INJECTION WRAPPER
1.0000 mg | INTRAMUSCULAR | Status: DC | PRN
Start: 2022-09-13 — End: 2022-09-14

## 2022-09-13 MED ORDER — INSULIN LISPRO 100 UNIT/ML SUB-Q SSIP VIAL
0.0000 [IU] | INJECTION | Freq: Four times a day (QID) | SUBCUTANEOUS | Status: DC
Start: 2022-09-13 — End: 2022-09-13

## 2022-09-13 MED ORDER — SODIUM BICARBONATE 1 MEQ/ML (8.4 %) INTRAVENOUS SOLUTION
INTRAVENOUS | Status: DC
Start: 2022-09-13 — End: 2022-09-14
  Filled 2022-09-13 (×4): qty 50

## 2022-09-13 MED ORDER — INSULIN LISPRO 100 UNIT/ML SUB-Q SSIP VIAL
0.0000 [IU] | INJECTION | Freq: Four times a day (QID) | SUBCUTANEOUS | Status: DC
Start: 2022-09-13 — End: 2022-09-14
  Administered 2022-09-13: 2 [IU] via SUBCUTANEOUS
  Administered 2022-09-13 – 2022-09-14 (×3): 0 [IU] via SUBCUTANEOUS

## 2022-09-13 MED ORDER — VANCOMYCIN IV - PHARMACIST TO DOSE PER PROTOCOL
Freq: Every day | Status: DC | PRN
Start: 2022-09-13 — End: 2022-09-14

## 2022-09-13 MED ORDER — ALBUMIN, HUMAN 25 % INTRAVENOUS SOLUTION
25.0000 g | Freq: Once | INTRAVENOUS | Status: DC
Start: 2022-09-13 — End: 2022-09-13

## 2022-09-13 MED ORDER — ETHYL ALCOHOL 62 % (NOZIN NASAL SANITIZER) NASAL SOLUTION - BULK BOTTLE
1.0000 | Freq: Two times a day (BID) | NASAL | Status: DC
Start: 2022-09-13 — End: 2022-09-14
  Administered 2022-09-13 – 2022-09-14 (×3): 1 via NASAL

## 2022-09-13 NOTE — Progress Notes (Signed)
Discussed on icu rounds.   Discussed with three daughters including POA at bedside.  Treating pneumonia and electrolyte abnormalities.  Daughter confirms poor po intake both food and drink.  Has been pureeing food.  Definitely sounds as if aspiration possible.   Confirmed no cpr, intubation.   They also expressed Mom had expressed previously that she would not want a feeding tube and this might include not just PEG but NGT as well.   We did discuss the prior in relationship for nutrition needs with sepsis and they are aware that if she can not eat or eat safely and does not get at least an NGT for nutrition, her mortality risk would increase.  They are understanding however reiterate this was Mom's wishes.  With low BP discussed pressors and central line as well.   They do not want anything invasive so no central line.   They would be agreeable to low dose vasopressors through peripheral line.    With above in mind, will continue Levaquin (allergy to pcn and cephalosporins), stop doxy as Levaquin covers atypicals, add flagyl for anaerobic coverage with possible aspiration.  Addition ivf bolus, hydration and albumin.  Palliative care to see.

## 2022-09-13 NOTE — H&P (Signed)
[place] MEDICINE [facility]    HOSPITALIST H&P    Mistina Komatsu 89 y.o. female ED05/ED05   Date of Service: 09/13/2022    Date of Admission:  08/14/2022   PCP: Terald Sleeper, DO Code Status:No CPR       Chief Complaint:  Shortness a breath     HPI:   This 89 year old female presents emergency department with family at bedside.  Patient comes in for shortness a breath and new onset of altered mental status.  Patient does have history of chronic kidney disease as well as diabetes.  Patient has been and slowly deteriorating at home.  At this point the family does not want CPR is unsure of intubation.  Patient was found to be septic from pneumonia and was requiring more oxygen than she normally has at home.  Patient is on 2 L at home was on 3-1/2 at time examination.  Patient was not able to wake up and answer any questions.  We will watch in the ICU setting overnight.  Patient does have significant history for CHF.    ED medications:   Medications Administered in the ED   vancomycin (VANCOCIN) 1,000 mg in NS 250 mL IVPB (has no administration in time range)   NS premix infusion (has no administration in time range)   ipratropium-albuterol 0.5 mg-3 mg(2.5 mg base)/3 mL Solution for Nebulization (3 mL Nebulization Given 08/30/2022 2215)   budesonide (PULMICORT RESPULES) 0.5 mg/2 mL nebulizer suspension (0.5 mg Nebulization Given 09/01/2022 2215)   levoFLOXacin (LEVAQUIN) 500 mg in D5W 100 mL premix IVPB (500 mg Intravenous New Bag/New Syringe 08/26/2022 2215)         PMHx:    Past Medical History:   Diagnosis Date    Allergic rhinitis     Arthropathy     Congestive heart failure (CMS HCC)     Diabetes mellitus, type 2 (CMS HCC)     Hypercholesterolemia     Hypertension     Renal failure     PSHx:   Past Surgical History:   Procedure Laterality Date    BREAST MASS EXCISION      HX CATARACT REMOVAL      HX ELBOW SURGERY      HX HYSTERECTOMY      HX LAP CHOLECYSTECTOMY      LAMINECTOMY      WRIST SURGERY          Allergies:    Allergies   Allergen Reactions    Aspirin     Cephalexin     Codeine     Methocarbamol     Penicillins     Social History  Social History     Tobacco Use    Smoking status: Never    Smokeless tobacco: Never   Vaping Use    Vaping status: Never Used   Substance Use Topics    Alcohol use: Never    Drug use: Never       Family History  Family Medical History:    Family history is unknown by patient.            Home Meds:      Prior to Admission medications    Medication Sig Start Date End Date Taking? Authorizing Provider   amLODIPine (NORVASC) 5 mg Oral Tablet Take 1 Tablet (5 mg total) by mouth Twice daily for 180 days Indications: high blood pressure 09/05/22 03/04/23  McClanahan, Mardee Postin, DO   cyanocobalamin (VITAMIN B12) 1,000 mcg/mL  Injection Solution Inject 1 mL (1,000 mcg total) under the skin Every 30 days 09/05/22   McClanahan, Mardee Postin, DO   amLODIPine (NORVASC) 5 mg Oral Tablet Take 1 Tablet (5 mg total) by mouth Twice daily for 180 days Indications: high blood pressure 04/24/22 09/05/22  McClanahan, Mardee Postin, DO   cyanocobalamin (VITAMIN B12) 1,000 mcg/mL Injection Solution Inject 1 mL (1,000 mcg total) under the skin Every 30 days 04/24/22 09/05/22  McClanahan, Mardee Postin, DO          ROS:     Limited ROS due to patient's medical conditions and metabolic encephalopathy.  Patient's granddaughters at bedside.  Phone conversation had with patient's daughter.    Results for orders placed or performed during the hospital encounter of 08/16/2022 (from the past 24 hour(s))   ARTERIAL BLOOD GAS/LACTATE   Result Value Ref Range    PH (ARTERIAL) 7.26 (L) 7.35 - 7.45    PCO2 (ARTERIAL) 44 35 - 45 mm/Hg    BICARBONATE (ARTERIAL) 18.3 (L) 20.0 - 26.0 mmol/L    MET-HEMOGLOBIN      LACTATE      CARBOXYHEMOGLOBIN 1.2 <=1.5 %    O2CT      %FIO2 (ARTERIAL) 40 %    PO2 (ARTERIAL) 70 (L) 80 - 100 mm/Hg    OXYHEMOGLOBIN 93.2 88.0 - 100.0 %    ALLEN TEST Y     DRAW SITE LRad     BASE DEFICIT 7.8 (H) 0.0 - 2.0  mmol/L   ECG 12 LEAD   Result Value Ref Range    Ventricular rate 106 BPM    Atrial Rate 106 BPM    PR Interval 138 ms    QRS Duration 92 ms    QT Interval 354 ms    QTC Calculation 470 ms    Calculated P Axis 91 degrees    Calculated R Axis 109 degrees    Calculated T Axis -62 degrees   LACTIC ACID LEVEL W/ REFLEX FOR LEVEL >2.0   Result Value Ref Range    LACTIC ACID 1.8 0.5 - 2.2 mmol/L   COVID-19, FLU A/B, RSV RAPID BY PCR   Result Value Ref Range    SARS-CoV-2 Not Detected Not Detected    INFLUENZA VIRUS TYPE A Not Detected Not Detected    INFLUENZA VIRUS TYPE B Not Detected Not Detected    RESPIRATORY SYNCTIAL VIRUS (RSV) Not Detected Not Detected   AMMONIA   Result Value Ref Range    AMMONIA 40 16 - 53 umol/L   TROPONIN-I   Result Value Ref Range    TROPONIN I 337 (H) <15 ng/L   THYROID STIMULATING HORMONE WITH FREE T4 REFLEX   Result Value Ref Range    TSH 2.270 0.450 - 5.330 uIU/mL   B-TYPE NATRIURETIC PEPTIDE (BNP),PLASMA   Result Value Ref Range    BNP >5,000 (H) 1 - 100 pg/mL   COMPREHENSIVE METABOLIC PANEL, NON-FASTING   Result Value Ref Range    SODIUM 156 (H) 136 - 145 mmol/L    POTASSIUM 4.7 3.5 - 5.1 mmol/L    CHLORIDE 128 (H) 98 - 107 mmol/L    CO2 TOTAL 19 (L) 21 - 31 mmol/L    ANION GAP 9 4 - 13 mmol/L    BUN 58 (H) 7 - 25 mg/dL    CREATININE 2.95 (H) 0.60 - 1.30 mg/dL    BUN/CREA RATIO 19 6 - 22    ESTIMATED GFR 14 (L) >59 mL/min/1.4m^2  ALBUMIN 2.7 (L) 3.5 - 5.7 g/dL    CALCIUM 8.5 (L) 8.6 - 10.3 mg/dL    GLUCOSE 161 (H) 74 - 109 mg/dL    ALKALINE PHOSPHATASE 38 34 - 104 U/L    ALT (SGPT) 7 7 - 52 U/L    AST (SGOT) 10 (L) 13 - 39 U/L    BILIRUBIN TOTAL 0.5 0.3 - 1.0 mg/dL    PROTEIN TOTAL 5.5 (L) 6.4 - 8.9 g/dL    ALBUMIN/GLOBULIN RATIO 1.0 0.8 - 1.4    OSMOLALITY, CALCULATED 328 (H) 270 - 290 mOsm/kg    CALCIUM, CORRECTED 9.5 8.9 - 10.8 mg/dL    GLOBULIN 2.8 (L) 2.9 - 5.4   C-REACTIVE PROTEIN (CRP)   Result Value Ref Range    C-REACTIVE PROTEIN (CRP) 1.5 (H) 0.1 - 0.5 mg/dL   MAGNESIUM    Result Value Ref Range    MAGNESIUM 2.0 1.9 - 2.7 mg/dL   PTT (PARTIAL THROMBOPLASTIN TIME)   Result Value Ref Range    APTT 27.7 25.0 - 38.0 seconds   PT/INR   Result Value Ref Range    PROTHROMBIN TIME 13.2 (H) 9.8 - 12.7 seconds    INR 1.13 (H) 0.84 - 1.10   CBC WITH DIFF   Result Value Ref Range    WBC 15.3 (H) 3.8 - 11.8 x10^3/uL    RBC 3.73 3.63 - 4.92 x10^6/uL    HGB 10.5 (L) 10.9 - 14.3 g/dL    HCT 09.6 04.5 - 40.9 %    MCV 91.9 75.5 - 95.3 fL    MCH 28.2 24.7 - 32.8 pg    MCHC 30.7 (L) 32.3 - 35.6 g/dL    RDW 81.1 (H) 91.4 - 17.7 %    PLATELETS 194 140 - 440 x10^3/uL    MPV 9.4 7.9 - 10.8 fL    NEUTROPHIL % 85 (H) 43 - 77 %    LYMPHOCYTE % 11 (L) 16 - 44 %    MONOCYTE % 4 (L) 5 - 13 %    EOSINOPHIL % 0 (L) 1 - 7 %    BASOPHIL % 0 0 - 1 %    NEUTROPHIL # 13.10 (H) 1.85 - 7.80 x10^3/uL    LYMPHOCYTE # 1.60 1.00 - 3.00 x10^3/uL    MONOCYTE # 0.60 0.30 - 1.00 x10^3/uL    EOSINOPHIL # 0.00 0.00 - 0.50 x10^3/uL    BASOPHIL # 0.00 0.00 - 0.10 x10^3/uL          Physical:  Filed Vitals:    08/18/2022 2300 09/11/2022 2315 08/17/2022 2330 09/02/2022 2345   BP: (!) 98/52 (!) 101/48 (!) 108/54 (!) 106/53   Pulse: (!) 103 (!) 102 (!) 107 (!) 111   Resp: (!) 41 (!) 40 (!) 37 (!) 42   Temp:       SpO2: 93% 92% 90% (!) 89%      General: Patient encephalopathic at time examination  Heart: Regular rate and rhythm.   Lungs: Clear to auscultation bilaterally with no wheezes or rales. Equal chest excursion.  No conversational dyspnea. No respiratory distress noted.   Abdomen: Soft, nontender, nondistended belly. Bowel sounds are present in all four quadrants. No rigidity.  No guarding.  No ascites.   Extremities:  Bilateral lower leg edema  Skin: Warm and dry without lesions. No ecchymosis noted.        Diagnostic studies:  No results found.       EKG interpretation:     @PEVF @  Assessment:  Active Hospital Problems   (*Primary Problem)    Diagnosis    *Sepsis (CMS HCC)    PNA (pneumonia)    Hypoxia    CKD (chronic kidney disease)  stage 5, GFR less than 15 ml/min (CMS HCC)       Plan:  Patient will be admitted for the above problems.    Sepsis/pneumonia  Placed in the ICU setting  Broad-spectrum IV antibiotic  Fluid resuscitate  Replete electrolytes as needed.  Will monitor electrolytes.  Repeat labs ordered for the a.m..  Oxygen to keep O2 saturation greater than 88%.  Phone conversation was had with patient's daughter who does not want CPR.  Unsure of intubation.  Does want to treat aggressively with antibiotics and fluids.  Patient has seen Nephrology in the past but is no longer following with them.  Patient to be watched in the ICU setting overnight.      The hospitalist has examined patient, and reviewed all material, and agrees with the above medical management at this time.      DVT prophylaxis:  Heparin      Burman Riis, FNP-BC    [facility] MEDICINE HOSPITALIST

## 2022-09-13 NOTE — Respiratory Therapy (Signed)
Continue to titrate per pox. Currently on HFNC 45 lpm FiO2 65%. POX 98%

## 2022-09-13 NOTE — Respiratory Therapy (Signed)
Patient placed on [facility] settings at this time for work of breathing. Patient tolerating settings well at this time. Patient given SVN tx inline without complication. No signs of respiratory distress noted at this time airway is stable and intact.

## 2022-09-13 NOTE — Respiratory Therapy (Signed)
After titration of HFNC to 40L/45% patient switched to NC 4 LPM. Pox currently 94.

## 2022-09-13 NOTE — Nurses Notes (Addendum)
Gale Journey- Rozzel MPOA does not want patient intubated or CPR performed if needed. Purcell Nails, NP made aware.

## 2022-09-13 NOTE — Respiratory Therapy (Signed)
Patient taken off of OM 25LPM and transferred onto [facility] settings at this time. Patient is tolerating settings well. Patient is a DNI and unable to follow commands at this time. No signs of respiratory distress noted at this time airway is stable and intact.

## 2022-09-13 NOTE — Nurses Notes (Signed)
Purcell Nails NP paged at this time due to changes in assessment and family wishes for patient. No new orders at this time.

## 2022-09-13 NOTE — Care Plan (Addendum)
Received to ICU 6 via stretcher from ED on Oxymask at 25L with SpO2 in mid 80's. Respiratory rate 40-50.  Accessory/abdominal muscles in use. Pt obtunded. Responds only to painful stimuli. Tachycardic in 120's.  ABX and IVF infusing upon arrival. Dimas Millin, NP at bedside to assess. MPOA contacted by provider regarding intubation and has decided to make pt complete DNR/ DNI. MPOA to return to bedside. Wishes to continue with current treatment at this time. Continuous monitoring in place per ICU standard of care. Skin and fall precautions initiated. Education and transition readiness ongoing.     Problem: Adult Inpatient Plan of Care  Goal: Plan of Care Review  Outcome: Ongoing (see interventions/notes)  Flowsheets (Taken 09/13/2022 0052)  Progress: declining     Problem: Adult Inpatient Plan of Care  Goal: Patient-Specific Goal (Individualized)  Outcome: Ongoing (see interventions/notes)

## 2022-09-13 NOTE — Nurses Notes (Signed)
Patient's family at bedside with patient.

## 2022-09-13 NOTE — Nurses Notes (Signed)
Patient moving right arm, and trying to talk to daughters.

## 2022-09-13 NOTE — ED Nurses Note (Signed)
Report called to ICU at this time.

## 2022-09-13 NOTE — Nurses Notes (Signed)
A refusal for resuscitative measures was signed by Theodoro Kos (MPOA), and placed into patient's chart.

## 2022-09-13 NOTE — Care Management Notes (Signed)
[facility]  Care Management Initial Evaluation    Patient Name: Stephanie Wright  Date of Birth: 04-28-1927  Sex: female  Date/Time of Admission: 08/16/2022  9:14 PM  Room/Bed: ICU06/A  Payor: [employer] MEDICARE / Plan: [employer] CHOICE PPO / Product Type: PPO /   Primary Care Providers:  McClanahan, Mardee Postin, DO, DO (General)    Pharmacy Info:   Preferred Pharmacy       [facility] [address] - [place], [place] - [address]    [address] [place] [address]    Phone: [phone] Fax: [phone]    Hours: Not open 24 hours          Emergency Contact Info:   Extended Emergency Contact Information  Primary Emergency Contact: King-Rozzel,Rachel  Mobile Phone: [phone]  Relation: Other  Preferred language: English  Interpreter needed? No  Secondary Emergency Contact: [facility]  Mobile Phone: [phone]  Relation: Daughter  Preferred language: English  Interpreter needed? No    History:   Stephanie Wright is a 89 y.o., female, admitted 09/13/22    Height/Weight: 157.5 cm (5\' 2" ) / 60 kg (132 lb 4.4 oz)     LOS: 0 days   Admitting Diagnosis: Sepsis (CMS HCC) [A41.9]    Assessment:    09/13/22 1527   Assessment Details   Assessment Type Admission   Date of Care Management Update 09/13/22   Readmission   Is this a readmission? No   Insurance Information/Type   Insurance type Medicare   Employment/Financial   Patient has Prescription Coverage?  Yes        Name of Insurance Coverage for Medications [employer]/[place]   Financial Concerns none   Living Environment   Lives With child(ren), adult   Living Arrangements house   Able to Return to Prior Arrangements yes   Living Arrangement Comments Pt lives with her daughter who is a paid care-giver with [employer].   Home Safety   Home Assessment: No Problems Identified   Home Accessibility no concerns;bed and bath on same level;stairs within home;stairs to enter home   Custody and Legal Status   Do you have a court appointed  guardian/conservator? No   Are you an emancipated minor? No   Custody Issues? No   Paternity Affidavit Requested? No   Care Management Plan   Discharge Planning Status initial meeting   Projected Discharge Date 09/18/22   Discharge plan discussed with: MPOA   CM will evaluate for rehabilitation potential no   Discharge Needs Assessment   Equipment Needed After Discharge oxygen;hospital bed;commode   Community Agency Name(s) [employer] for home O2/[employer] for other DME   Discharge Facility/Level of Care Needs Home (Patient/Family Member/other)(code 1);Home with Hospice (code 82)   Transportation Available ambulance   Referral Information   Arrived From home or self-care   ADVANCE DIRECTIVES   Does the Patient have an Advance Directive? Yes, Patient Does Have Advance Directive for Healthcare Treatment   Type of Advance Directive Completed Medical Power of Attorney   Copy of Advance Directives in Chart? Yes, Copy on Chart.(Specify in Comment Which Advance Directive)   Name of MPOA or Healthcare Surrogate Carney Bern   Phone Number of MPOA or Healthcare Surrogate [phone]   LAY CAREGIVER   Lay Caregiver Name Hazle Coca           Discharge Plan:  Home (Patient/Family Member/other) (code 1), Home with Hospice (code 50)    Initial CM assessment completed.  Talked with MPOA, pts daughter, Hazle Coca.  She states pt lives with her other sister who is her paid care-giver from [employer].  She state pt has a hospital bed, BSC, wheelchair.  She denies HH prior to admission.  She states pt has been bed bound for last several mths. Discussed Hospice services with family.  They are not wanting hospice services at this time but are going to think about it over next day.  Pt is a DNR, she is currently on HFNC.  She has home O2 with [employer]. She's states pts discharge goals are to return home with family.  She is going to let CM/nursing staff know if family should decide on Hopsice  Services over the next day or two.      The patient will continue to be evaluated for developing discharge needs.     Case Manager: Julienne Kass, RN  Phone: [phone]

## 2022-09-13 NOTE — Nurses Notes (Signed)
Patient has not voided since she arrived to unit. Bladder scan performed and showing of urine in bladder currently.

## 2022-09-13 NOTE — Nurses Notes (Signed)
Patient arousing more when compared to her arrival to the unit. She will blink on command, and she has attempted to swallow. Daughters remain at bedside with patient.

## 2022-09-13 NOTE — Respiratory Therapy (Signed)
Titrate HFNC per pox. Currently decreased to 85% in increments of 5. Tolerating well. Pox 98%.

## 2022-09-14 LAB — MANUAL DIFFERENTIAL
BAND %: 36 % — ABNORMAL HIGH (ref 5–11)
BANDS NEUTROPHILS MANUAL: 36
LYMPHOCYTE %: 11 % — ABNORMAL LOW (ref 25–45)
LYMPHOCYTE ABSOLUTE: 1.76 10*3/uL (ref 1.10–5.00)
LYMPHOCYTES MANUAL: 11
METAMYELOCYTE %: 4 %
METAMYELOCYTE ABSOLUTE: 0.64 10*3/uL
METAMYELOCYTES MANUAL: 4
MONOCYTE %: 1 % (ref 0–12)
MONOCYTE ABSOLUTE: 0.16 10*3/uL (ref 0.00–1.30)
MONOCYTES MANUAL: 1
NEUTROPHIL %: 48 % (ref 40–76)
NEUTROPHIL ABSOLUTE: 13.44 10*3/uL — ABNORMAL HIGH (ref 1.80–8.40)
NEUTROPHILS MANUAL: 48
PLATELET MORPHOLOGY COMMENT: DECREASED
TOTAL CELLS COUNTED [#] IN BLOOD: 100
WBC: 16 10*3/uL

## 2022-09-14 LAB — POC BLOOD GLUCOSE (RESULTS)
GLUCOSE, POC: 180 mg/dl — ABNORMAL HIGH (ref 70–100)
GLUCOSE, POC: 151 mg/dl — ABNORMAL HIGH (ref 70–100)

## 2022-09-14 LAB — CBC
HCT: 24.3 % — ABNORMAL LOW (ref 31.2–41.9)
HGB: 7.8 g/dL — ABNORMAL LOW (ref 10.9–14.3)
MCH: 29 pg (ref 24.7–32.8)
MCHC: 31.9 g/dL — ABNORMAL LOW (ref 32.3–35.6)
MCV: 90.7 fL (ref 75.5–95.3)
MPV: 10.1 fL (ref 7.9–10.8)
PLATELETS: 94 10*3/uL — ABNORMAL LOW (ref 140–440)
RBC: 2.68 10*6/uL — ABNORMAL LOW (ref 3.63–4.92)
RDW: 19.5 % — ABNORMAL HIGH (ref 12.3–17.7)
WBC: 16 10*3/uL — ABNORMAL HIGH (ref 3.8–11.8)

## 2022-09-14 LAB — BASIC METABOLIC PANEL
ANION GAP: 20 mmol/L — ABNORMAL HIGH (ref 4–13)
BUN/CREA RATIO: 19 (ref 6–22)
BUN: 65 mg/dL — ABNORMAL HIGH (ref 7–25)
CALCIUM: 9 mg/dL (ref 8.6–10.3)
CHLORIDE: 120 mmol/L — ABNORMAL HIGH (ref 98–107)
CO2 TOTAL: 14 mmol/L — ABNORMAL LOW (ref 21–31)
CREATININE: 3.42 mg/dL — ABNORMAL HIGH (ref 0.60–1.30)
ESTIMATED GFR: 12 mL/min/{1.73_m2} — ABNORMAL LOW (ref >59)
GLUCOSE: 200 mg/dL — ABNORMAL HIGH (ref 74–109)
OSMOLALITY, CALCULATED: 330 mOsm/kg — ABNORMAL HIGH (ref 270–290)
POTASSIUM: 4.2 mmol/L (ref 3.5–5.1)
SODIUM: 154 mmol/L — ABNORMAL HIGH (ref 136–145)

## 2022-09-14 LAB — PHOSPHORUS: PHOSPHORUS: 2.9 mg/dL — ABNORMAL LOW (ref 3.7–7.2)

## 2022-09-14 LAB — MAGNESIUM: MAGNESIUM: 1.8 mg/dL — ABNORMAL LOW (ref 1.9–2.7)

## 2022-09-14 LAB — VANCOMYCIN, TROUGH: VANCOMYCIN TROUGH: 8.9 ug/mL (ref 5.0–10.0)

## 2022-09-14 MED ORDER — MAGNESIUM SULFATE 1 GRAM/100 ML IN DEXTROSE 5 % INTRAVENOUS PIGGYBACK
1.0000 g | INJECTION | Freq: Once | INTRAVENOUS | Status: AC
Start: 2022-09-14 — End: 2022-09-14
  Administered 2022-09-14: 1 g via INTRAVENOUS
  Administered 2022-09-14: 0 g via INTRAVENOUS
  Filled 2022-09-14: qty 100

## 2022-09-14 MED ORDER — VANCOMYCIN 10 GRAM INTRAVENOUS SOLUTION
20.0000 mg/kg | INTRAVENOUS | Status: AC
Start: 2022-09-14 — End: 2022-09-14
  Administered 2022-09-14: 0 mg via INTRAVENOUS
  Administered 2022-09-14: 1000 mg via INTRAVENOUS
  Filled 2022-09-14 (×4): qty 10

## 2022-09-14 MED ORDER — ALBUMIN, HUMAN 25 % INTRAVENOUS SOLUTION
50.0000 g | Freq: Once | INTRAVENOUS | Status: DC
Start: 2022-09-14 — End: 2022-09-14

## 2022-09-14 MED ORDER — SCOPOLAMINE 1 MG OVER 3 DAYS TRANSDERMAL PATCH
1.0000 | MEDICATED_PATCH | TRANSDERMAL | Status: DC
Start: 2022-09-14 — End: 2022-09-14

## 2022-09-14 MED ORDER — HYOSCYAMINE 0.125 MG SUBLINGUAL TABLET
0.1250 mg | SUBLINGUAL_TABLET | SUBLINGUAL | 0 refills | Status: DC | PRN
Start: 2022-09-14 — End: 2022-09-14

## 2022-09-14 DEATH — deceased

## 2022-09-17 LAB — ADULT ROUTINE BLOOD CULTURE, SET OF 2 BOTTLES (BACTERIA AND YEAST)
BLOOD CULTURE, ROUTINE: NO GROWTH
BLOOD CULTURE, ROUTINE: NO GROWTH

## 2022-10-15 NOTE — Nurses Notes (Signed)
All belongings packed and sent to funeral home

## 2022-10-15 NOTE — Nurses Notes (Signed)
Pt DC to funeral home

## 2022-10-15 NOTE — Care Plan (Signed)
Pt deceased

## 2022-10-15 NOTE — Nurses Notes (Signed)
Telemetry monitor reading asystole at this time. No pulses felt on assessment. Maxine Glenn, NP notified.

## 2022-10-15 NOTE — Nurses Notes (Signed)
Spoke with Pt's daughter Fleet Contras [facility]) who stated that she wanted to make pt comfort care but leave the fluids infusing at this time.   Maxine Glenn FNP notified.

## 2022-10-15 NOTE — Nurses Notes (Signed)
Maxine Glenn, NP at bedside. Pt pronounced at the time

## 2022-10-15 NOTE — Respiratory Therapy (Signed)
Patient remains on [facility] settings at this time patient is tolerating well without complication. No signs of respiratory distress noted at this time airway is stable and intact.

## 2022-10-15 NOTE — Care Plan (Signed)
Patient remains in ICU6 at this time. Patient is not following commands at this time and responds to painful stimuli. Patient's heart rate has fluctuated throughout the night, see rhythm strips attached to chart review. Patient has bicarb gtt infusing through peripheral IV. Patient is on airvo at 40 L 40%. Fall and skin precautions are in place. Education and transition readiness ongoing.  Problem: Adult Inpatient Plan of Care  Goal: Plan of Care Review  Outcome: Ongoing (see interventions/notes)  Goal: Patient-Specific Goal (Individualized)  Outcome: Ongoing (see interventions/notes)  Goal: Absence of Hospital-Acquired Illness or Injury  Outcome: Ongoing (see interventions/notes)  Intervention: Identify and Manage Fall Risk  Recent Flowsheet Documentation  Taken 10/10/2022 0400 by Harvest Forest, RN  Safety Promotion/Fall Prevention: safety round/check completed  Taken 09/27/2022 0000 by Harvest Forest, RN  Safety Promotion/Fall Prevention: safety round/check completed  Taken 09/13/2022 2000 by Harvest Forest, RN  Safety Promotion/Fall Prevention: safety round/check completed  Intervention: Prevent Skin Injury  Recent Flowsheet Documentation  Taken 09/20/2022 0534 by Harvest Forest, RN  Body Position:   side lying, left   heels elevated off mattress  Taken 09/16/2022 0400 by Harvest Forest, RN  Body Position:   side lying, right   heels elevated off mattress  Taken 09/28/2022 0145 by Harvest Forest, RN  Body Position:   side lying, left   heels elevated off mattress  Taken 09/15/2022 0000 by Harvest Forest, RN  Body Position:   side lying, right   heels elevated off mattress  Skin Protection: adhesive use limited  Taken 09/13/2022 2358 by Harvest Forest, RN  Body Position:   side lying, right   heels elevated off mattress  Taken 09/13/2022 2255 by Harvest Forest, RN  Skin Protection: adhesive use limited  Taken 09/13/2022 2200 by Harvest Forest, RN  Body Position:   side lying, left   heels elevated off mattress  Taken 09/13/2022 2000 by  Harvest Forest, RN  Body Position:   side lying, right   heels elevated off mattress  Skin Protection: adhesive use limited  Goal: Optimal Comfort and Wellbeing  Outcome: Ongoing (see interventions/notes)  Goal: Rounds/Family Conference  Outcome: Ongoing (see interventions/notes)

## 2022-10-15 NOTE — Progress Notes (Signed)
Patient was noted to brady down to asystole on telemetry monitor at 1519.   Patient assessed and no spontaneous respirations visualized, no palpable pulses, no corneal reflex, no heart sounds auscultated. Patient pronounced dead at 1618/10/20.

## 2022-10-15 NOTE — Progress Notes (Signed)
[facility]               IP PROGRESS NOTE      Adaria, Hole  Date of Admission:  08/19/2022  Date of Birth:  07-Feb-1928  Date of Service:  09/16/2022    Hospital Day:  LOS: 1 day     Subjective:   Patient seen and examined by ICU/CCU physician.  Patient has been placed on HFNC at 40 liters and 45% FiO2.   Patient's heart rate fluctuating to the 140s and will drop into the 60s.  Family members at bedside.  Family trying to make decision on comfort measures/hospice. Do not want central line or feeding tube.     Vital Signs:  Temp (24hrs) Max:36.8 C (98.2 F)      Temperature: 36.7 C (98.1 F)  BP (Non-Invasive): (!) 127/50  MAP (Non-Invasive): 73 mmHG  Heart Rate: (!) 104  Respiratory Rate: (!) 39  SpO2: 95 %    Current Medications:  acetaminophen (TYLENOL) tablet, 650 mg, Oral, Q4H PRN  alcohol 62 % (NOZIN NASAL SANITIZER) nasal solution, 1 Each, Each Nostril, 2x/day  Correction/SSIP insulin lispro (HumaLOG) 100 units/mL injection, 0-5 Units, Subcutaneous, 4x/day AC  D5W 1,000 mL with sodium bicarbonate 50 mEq infusion, , Intravenous, Continuous  dextrose (GLUTOSE) 40% oral gel, 15 g, Oral, Q15 Min PRN  dextrose 50% (0.5 g/mL) injection - syringe, 12.5 g, Intravenous, Q15 Min PRN  glucagon (GLUCAGEN DIAGNOSTIC KIT) injection 1 mg, 1 mg, IntraMUSCULAR, Once PRN  heparin 5,000 unit/mL injection, 5,000 Units, Subcutaneous, Q8HRS  ipratropium-albuterol 0.5 mg-3 mg(2.5 mg base)/3 mL Solution for Nebulization, 3 mL, Nebulization, 4x/day  ipratropium-albuterol 0.5 mg-3 mg(2.5 mg base)/3 mL Solution for Nebulization, 3 mL, Nebulization, Q4H PRN  levoFLOXacin (LEVAQUIN) 500 mg in D5W 100 mL premix IVPB, 500 mg, Intravenous, Q48H  LORazepam (ATIVAN) 2 mg/mL injection, 1 mg, Intravenous, Q2H PRN  metroNIDAZOLE (FLAGYL) 500 mg in NS 100 mL premix IVPB, 500 mg, Intravenous, Q12H  morphine 2 mg/mL injection, 2 mg, Intravenous, Q4H PRN  ondansetron (ZOFRAN) 2 mg/mL injection, 4 mg, Intravenous, Q6H PRN  Vancomycin  IV - Pharmacist to Dose per Protocol, , Does not apply, Daily PRN    Current Orders:  Active Orders   Microbiology    MRSA SCREEN     Frequency: ONE TIME     Number of Occurrences: 1 Occurrences   Lab    BASIC METABOLIC PANEL     Frequency: ONE TIME     Number of Occurrences: 1 Occurrences    BASIC METABOLIC PANEL     Frequency: ONE TIME     Number of Occurrences: 1 Occurrences    CBC     Frequency: ONE TIME     Number of Occurrences: 1 Occurrences    CBC     Frequency: ONE TIME     Number of Occurrences: 1 Occurrences    DRUG SCREEN, NO CONFIRMATION, URINE     Frequency: ONE TIME     Number of Occurrences: 1 Occurrences    URINALYSIS, MACROSCOPIC     Frequency: Once     Number of Occurrences: 1 Occurrences    URINALYSIS, MACROSCOPIC AND MICROSCOPIC W/CULTURE REFLEX     Frequency: ONE TIME     Number of Occurrences: 1 Occurrences    URINALYSIS, MICROSCOPIC     Frequency: Once     Number of Occurrences: 1 Occurrences    VANCOMYCIN, TROUGH     Frequency: ONE TIME  Number of Occurrences: 1 Occurrences   Diet    DIET NPO - NOW     Frequency: All Meals     Number of Occurrences: 1 Occurrences   Nursing    ACTIVITY     Frequency: UNTIL DISCONTINUED     Number of Occurrences: Until Specified    CONTINUOUS CARDIAC MONITORING (ED USE ONLY)     Frequency: ONE TIME     Number of Occurrences: 1 Occurrences    HYPOGLYCEMIA MANAGEMENT - CONSCIOUS PATIENT W/DIET ORDER     Frequency: UNTIL DISCONTINUED     Number of Occurrences: Until Specified    HYPOGLYCEMIA MANAGEMENT - UNCONSCIOUS/ALTERED/NPO PATIENT     Frequency: UNTIL DISCONTINUED     Number of Occurrences: Until Specified    HYPOGLYCEMIA TREATMENT ALGORITHM     Frequency: UNTIL DISCONTINUED     Number of Occurrences: Until Specified    INTAKE AND OUTPUT QSHIFT     Frequency: QSHIFT     Number of Occurrences: Until Specified    MISCELLANEOUS MD/DO TO NURSE     Frequency: UNTIL DISCONTINUED     Number of Occurrences: Until Specified     Order Comments: ONLY 2 HOURS OF  FLUIDS AT UP TO 250 CC TOTAL      MISCELLANEOUS MD/DO TO NURSE     Frequency: UNTIL DISCONTINUED     Number of Occurrences: Until Specified     Order Comments: Please have dietary bring family hospice food cart/coffee/drinks.      Notify MD Vital Signs     Frequency: PRN     Number of Occurrences: Until Specified    PT IS INTERMEDIATE RISK FOR VENOUS THROMBOEMBOLISM     Frequency: CONTINUOUS     Number of Occurrences: Until Specified    PULSE OXIMETRY Q4H     Frequency: Q4H     Number of Occurrences: Until Specified    TELEMETRY MONITORING - Continuous     Frequency: CONTINUOUS     Number of Occurrences: Until Specified    VITAL SIGNS  Q2H     Frequency: Q2H     Number of Occurrences: 250 Occurrences    VITAL SIGNS MULTI FREQUENCY     Frequency: UNTIL DISCONTINUED     Number of Occurrences: Until Specified    WEIGH PATIENT     Frequency: DAILY (0600)     Number of Occurrences: Until Specified    WEIGH PATIENT     Frequency: UPON ADMISSION     Number of Occurrences: 1 Occurrences   Code Status    NO  CPR     Frequency: CONTINUOUS     Number of Occurrences: Until Specified   Consult    IP CONSULT TO PALLIATIVE CARE     Frequency: ONE TIME     Number of Occurrences: 1 Occurrences   Respiratory Care    OXYGEN - HIGH FLOW BLENDED NC (ADULTS)     Frequency: CONTINUOUS     Number of Occurrences: Until Specified     Order Comments: Titrate patient settings per RT for patient comfort and oxygenation.      PULSE OXIMETRY - CONTINUOUS     Frequency: CONTINUOUS     Number of Occurrences: Until Specified   Point of Care Testing    PERFORM POC WHOLE BLOOD GLUCOSE     Frequency: ONE TIME     Number of Occurrences: 1 Occurrences    PERFORM POC WHOLE BLOOD GLUCOSE     Frequency: TID AC & HS  Number of Occurrences: Until Specified   Medications    acetaminophen (TYLENOL) tablet     Frequency: Q4H PRN     Dose: 650 mg     Route: Oral    alcohol 62 % (NOZIN NASAL SANITIZER) nasal solution     Frequency: 2x/day     Dose: 1 Each      Route: Each Nostril    Correction/SSIP insulin lispro (HumaLOG) 100 units/mL injection     Frequency: 4x/day AC     Dose: 0-5 Units     Route: Subcutaneous    D5W 1,000 mL with sodium bicarbonate 50 mEq infusion     Frequency: Continuous     Route: Intravenous    dextrose (GLUTOSE) 40% oral gel     Frequency: Q15 Min PRN     Dose: 15 g     Route: Oral    dextrose 50% (0.5 g/mL) injection - syringe     Frequency: Q15 Min PRN     Dose: 12.5 g     Route: Intravenous    glucagon (GLUCAGEN DIAGNOSTIC KIT) injection 1 mg     Frequency: Once PRN     Dose: 1 mg     Route: IntraMUSCULAR    heparin 5,000 unit/mL injection     Frequency: Q8HRS     Dose: 5,000 Units     Route: Subcutaneous    ipratropium-albuterol 0.5 mg-3 mg(2.5 mg base)/3 mL Solution for Nebulization     Frequency: 4x/day     Dose: 3 mL     Route: Nebulization    ipratropium-albuterol 0.5 mg-3 mg(2.5 mg base)/3 mL Solution for Nebulization     Frequency: Q4H PRN     Dose: 3 mL     Route: Nebulization    levoFLOXacin (LEVAQUIN) 500 mg in D5W 100 mL premix IVPB     Frequency: Q48H     Dose: 500 mg     Route: Intravenous    LORazepam (ATIVAN) 2 mg/mL injection     Frequency: Q2H PRN     Dose: 1 mg     Route: Intravenous    metroNIDAZOLE (FLAGYL) 500 mg in NS 100 mL premix IVPB     Frequency: Q12H     Dose: 500 mg     Route: Intravenous    morphine 2 mg/mL injection     Frequency: Q4H PRN     Dose: 2 mg     Route: Intravenous    ondansetron (ZOFRAN) 2 mg/mL injection     Frequency: Q6H PRN     Dose: 4 mg     Route: Intravenous    Vancomycin IV - Pharmacist to Dose per Protocol     Frequency: Daily PRN     Route: Does not apply   Review of Systems:  Focused review of system was completed. Refer to the HPI for ROS details.     Today's Physical Exam:  Physical Exam  Vitals reviewed: Patient examined by icu/ccu physician..   Cardiovascular:      Comments: Hr 140's but will go back down to 60's at times.   Neurological:      Comments: limited     I/O:  I/O last 24  hours:    Intake/Output Summary (Last 24 hours) at 09/25/2022 1012  Last data filed at 09/30/2022 0921  Gross per 24 hour   Intake 2556.25 ml   Output --   Net 2556.25 ml     I/O current shift:  08/01 0700 -  08/01 1859  In: 100   Out: -   Labs:  Reviewed: I have reviewed all lab results.  Lab Results Today:    Results for orders placed or performed during the hospital encounter of 09/11/2022 (from the past 24 hour(s))   POC BLOOD GLUCOSE (RESULTS)   Result Value Ref Range    GLUCOSE, POC 223 (H) 70 - 100 mg/dl   POC BLOOD GLUCOSE (RESULTS)   Result Value Ref Range    GLUCOSE, POC 223 (H) 70 - 100 mg/dl   POC BLOOD GLUCOSE (RESULTS)   Result Value Ref Range    GLUCOSE, POC 198 (H) 70 - 100 mg/dl   POC BLOOD GLUCOSE (RESULTS)   Result Value Ref Range    GLUCOSE, POC 225 (H) 70 - 100 mg/dl   VANCOMYCIN, TROUGH   Result Value Ref Range    VANCOMYCIN TROUGH 8.9 5.0 - 10.0 ug/mL   BASIC METABOLIC PANEL   Result Value Ref Range    SODIUM 154 (H) 136 - 145 mmol/L    POTASSIUM 4.2 3.5 - 5.1 mmol/L    CHLORIDE 120 (H) 98 - 107 mmol/L    CO2 TOTAL 14 (L) 21 - 31 mmol/L    ANION GAP 20 (H) 4 - 13 mmol/L    CALCIUM 9.0 8.6 - 10.3 mg/dL    GLUCOSE 413 (H) 74 - 109 mg/dL    BUN 65 (H) 7 - 25 mg/dL    CREATININE 2.44 (H) 0.60 - 1.30 mg/dL    BUN/CREA RATIO 19 6 - 22    ESTIMATED GFR 12 (L) >59 mL/min/1.50m^2    OSMOLALITY, CALCULATED 330 (H) 270 - 290 mOsm/kg   CBC   Result Value Ref Range    WBC 16.0 (H) 3.8 - 11.8 x10^3/uL    RBC 2.68 (L) 3.63 - 4.92 x10^6/uL    HGB 7.8 (L) 10.9 - 14.3 g/dL    HCT 01.0 (L) 27.2 - 41.9 %    MCV 90.7 75.5 - 95.3 fL    MCH 29.0 24.7 - 32.8 pg    MCHC 31.9 (L) 32.3 - 35.6 g/dL    RDW 53.6 (H) 64.4 - 17.7 %    PLATELETS 94 (L) 140 - 440 x10^3/uL    MPV 10.1 7.9 - 10.8 fL   MAGNESIUM - AM ONCE   Result Value Ref Range    MAGNESIUM 1.8 (L) 1.9 - 2.7 mg/dL   PHOSPHORUS - AM ONCE   Result Value Ref Range    PHOSPHORUS 2.9 (L) 3.7 - 7.2 mg/dL   MANUAL DIFFERENTIAL   Result Value Ref Range    WBC 16.0 x10^3/uL     NEUTROPHIL % 48 40 - 76 %    LYMPHOCYTE % 11 (L) 25 - 45 %    MONOCYTE % 1 0 - 12 %    EOSINOPHIL %      BASOPHIL %      METAMYELOCYTE % 4 %    MYELOCYTE %      PROMYELOCYTE %      BAND % 36 (H) 5 - 11 %    BLAST %      OTHER %      NEUTROPHIL ABSOLUTE 13.44 (H) 1.80 - 8.40 x10^3/uL    LYMPHOCYTE ABSOLUTE 1.76 1.10 - 5.00 x10^3/uL    MONOCYTE ABSOLUTE 0.16 0.00 - 1.30 x10^3/uL    EOSINOPHIL ABSOLUTE      BASOPHIL ABSOLUTE      METAMYELOCYTE ABSOLUTE 0.64 x10^3/uL    MYELOCYTE  ABSOLUTE      PROMYELOCYTE ABSOLUTE      BLAST ABSOLUTE      OTHER CELL ABSOLUTE      ANISOCYTOSIS 1+ (10-25%)     POLYCHROMASIA      POIKILOCYTOSIS 2+ (50%)     BASOPHILIC STIPPLING      MICROCYTOSIS      MACROCYTOSIS      ROULEAUX      SCHISTOCYTES 1+ (10-25%)     SPHEROCYTES      TARGET CELLS      TEARDROP CELLS      OVALOCYTE (ELLIPTOCYTE) 1+ (10-25%)     CRENATED RED CELLS      STOMATOCYTES      ACANTHOCYTES (SPUR CELL) 1+ (10-25%)     ECHINOCYTE (BURR CELL)      BLISTER CELLS      RBC AGGLUTINATES      HOWELL JOLLY BODIES      ATYPICAL LYMPHOCYTES      TOXIC GRANULATION      DOHLE BODIES      TOXIC VACUOLIZATION      AUER RODS      BASKET CELLS      HYPERSEGMENTATION      LARGE PLATELETS Present     PLATELET CLUMPS      PLATELET MORPHOLOGY COMMENT Decreased     BANDS NEUTROPHILS MANUAL 36     BAND ABSOLUTE      NEUTROPHILS MANUAL 48     LYMPHOCYTES MANUAL 11     MONOCYTES MANUAL 1     EOSINOPHILS MANUAL      BASOPHILS MANUAL      PROMYELOCYTES MANUAL      MYELOCYTES MANUAL      METAMYELOCYTES MANUAL 4     BLASTS MANUAL      TOTAL CELLS COUNTED [#] IN BLOOD 100     OTHER CELLS MANUAL      NUCLEATED RBC MANUAL      PLASMA CELL %      PLASMA CELL ABSOLUE      PLASMA CELLS MANUAL      HYPOCHROMASIA     POC BLOOD GLUCOSE (RESULTS)   Result Value Ref Range    GLUCOSE, POC 180 (H) 70 - 100 mg/dl     Micro Results: No results found for any visits on 09/10/2022 (from the past 24 hour(s)).  Images:   CT BRAIN WO IV CONTRAST    Result Date:  09/13/2022  Impression CHRONIC CHANGES.  NO ACUTE FINDINGS. One or more dose reduction techniques were used (e.g., Automated exposure control, adjustment of the mA and/or kV according to patient size, use of iterative reconstruction technique). Radiologist location ID: [id number]     XR AP MOBILE CHEST    Result Date: 08/17/2022  Impression Suspected pneumonia within the right upper and right lower lobes. Increased density in the right hilum. Question hilar mass or nodal enlargement. Recommend a short-term follow-up radiographs to ensure that these findings resolve. If they do not, would consider CT chest. Radiologist location ID: [id number]     CT BRAIN WO IV CONTRAST   Final Result   CHRONIC CHANGES.  NO ACUTE FINDINGS.          One or more dose reduction techniques were used (e.g., Automated exposure control, adjustment of the mA and/or kV according to patient size, use of iterative reconstruction technique).         Radiologist location ID: [id number]         XR AP  MOBILE CHEST   Final Result   Suspected pneumonia within the right upper and right lower lobes. Increased density in the right hilum. Question hilar mass or nodal enlargement. Recommend a short-term follow-up radiographs to ensure that these findings resolve. If they do not, would consider CT chest.            Radiologist location ID: [id number]         Problem List:  Active Hospital Problems   (*Primary Problem)    Diagnosis    *Sepsis (CMS HCC)    PNA (pneumonia)    Hypoxia    CKD (chronic kidney disease) stage 5, GFR less than 15 ml/min (CMS HCC)   Assessment/ Plan:   Sepsis secondary to pneumonia:  Pneumonia, suspect aspiration pneumonia:  After discussion with family who have been pureeing food for patient, felt likely aspiration.   Patient being treated with iv fluids, albumin, Levaquin, Vancomycin, and flagyl.   Respiratory panel is negative.   Blood cultures negative to date.   MRSA screen ordered.     Acute on chronic respiratory  failure with hypoxia:  Secondary to above. On 2 liters of oxygen at home.   Currently requiring HFNC at 40 liters and 45% FiO2.   Titrate settings as able to maintain oxygen saturation 90-92%.   Patient is DNR code status.     AKI on CKD stage 5:  Felt to be secondary to sepsis and dehydration. Patient has had poor oral intake for some time now.   Renal function is worsening. Bun at 65 and creatinine at 3.42.   Remains on bicarb drip in D5W.   Monitor urine output and renal function.     Hypernatremia:  Due to poor oral intake and dehydration.   On iv fluids as above.     Dehydration:  Due to poor po intake of both food and drink.   Patient on bicarb drip.   Did discuss option of NG tube/PEG tube. However, patient's family reports patient expressed she would not want a feeding tube. Dicussed yesterday if patient cannot eat or drink safely and does not get at least NG tube for nutrition her mortality risk would increase. Family reiterated understanding of this.     Chronic diastolic CHF:  Chronic, does not appear to have exacerbation.     Diabetes, type 2:  Accu-checks with issc.     Anemia in CKD and hx iron deficiency:  Hgb at 7.8 hct 24.3 today.   No acute signs of bleeding reported.   Continue to monitor and will transfuse as indicated.     HTN:  Holding home Norvasc.   Patient has been hypotensive at times.     Other:  Palliative care referral placed.   Family deciding how to proceed in terms of treatments, following patient's wishes in regards to NG/PEG, and consideration of comfort/hospice care.   Further treatment adjustments will be made based off clinical course.  See orders for further details.  Hospitalist personally evaluated and examined the patient in conjunction with the MLP and agree with the assessments, treatment plan and disposition of the patient as recorded by the [facility].     DVT/PE Prophylaxis: Heparin    Maxine Glenn, FNP-BC

## 2022-10-15 NOTE — Care Management Notes (Signed)
Pt made comfort measures.  Discussed Hospice with family.  They are wanting pt to remain in unit at this time.  Talked with nursing staff, They do not feel like pt will linger with BP being so low. Family is still not agreeing to Hospice services at this time.

## 2022-10-15 NOTE — Respiratory Therapy (Signed)
Taken off HFNC and placed on NC 4 lpm for comfort.

## 2022-10-15 NOTE — Discharge Summary (Signed)
[place] MEDICINE [facility]     DISCHARGE SUMMARY      PATIENT NAME:  Stephanie Wright   MRN:  [id number]  DOB:  1927-03-24    INPATIENT ADMISSION DATE: 09/02/2022   DATE OF DISCHARGE:  10/11/2022     ATTENDING PHYSICIAN:  Marrianne Mood, MD    HOSPITAL PRESENTATION:  Please see full admission H&P for details.    As per HPI:  This 89 year old female presents emergency department with family at bedside. Patient comes in for shortness a breath and new onset of altered mental status. Patient does have history of chronic kidney disease as well as diabetes. Patient has been and slowly deteriorating at home. At this point the family does not want CPR is unsure of intubation. Patient was found to be septic from pneumonia and was requiring more oxygen than she normally has at home. Patient is on 2 L at home was on 3-1/2 at time examination. Patient was not able to wake up and answer any questions. We will watch in the ICU setting overnight. Patient does have significant history for CHF.     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 Patient was placed on IV fluids, Levaquin, Flagyl, and Vancomycin. Patient was initially on nasal cannula oxygen support but progressed to HFNC. Several discussions had with patient's family and MPOA regarding her condition, treatment options, code status, guarded prognosis, and patient wishes. Family and MPOA did make patient DNR code status. They decided not to proceed with NG tube or PEG tube per their mother's wishes. Patient hypotensive. MPOA and family did not want central line placed. Did agree to albumin infusions and iv fluids. Patient has had decreased  oral intake for some time now. She has also been bed bound. Patient's family and MPOA decided today to make patient comfort measures only with exception of leaving iv fluids. Comfort measure orders placed. Patient expired at 78 with family at bedside. Patient pronounced at 1620. (See pronouncement note and nursing notes for further details.)    PROBLEM LIST:  Active Hospital Problems    Diagnosis Date Noted    Principal Problem: Sepsis (CMS HCC) [A41.9] 09/13/2022    PNA (pneumonia) [J18.9] 09/13/2022    Hypoxia [R09.02] 06/07/2021    CKD (chronic kidney disease) stage 5, GFR less than 15 ml/min (CMS HCC) [N18.5] 05/20/2021      Resolved Hospital Problems   No resolved problems to display.     Active Non-Hospital Problems    Diagnosis Date Noted    Secondary hyperparathyroidism (CMS HCC) 04/24/2022    Dementia (CMS HCC) 04/24/2022    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/21/2021    Urinary incontinence 09/06/2021    Hyperlipidemia 06/07/2021    Benign hypertension 06/07/2021    Iron deficiency anemia secondary to inadequate dietary iron intake 06/07/2021    Chronic diastolic CHF (congestive heart failure) (CMS HCC) 06/07/2021    Vitamin D deficiency 05/20/2021    Anemia in chronic kidney disease (CKD) 05/20/2021    Hypomagnesemia 05/20/2021    Hypokalemia 05/20/2021    Type 2 diabetes mellitus (CMS HCC) 05/20/2021     PHYSICAL EXAM:  See death pronouncement note.    LABS WITHIN LAST 24 HOURS:   Results for orders placed or  performed during the hospital encounter of 08/23/2022 (from the past 24 hour(s))   POC BLOOD GLUCOSE (RESULTS)   Result Value Ref Range    GLUCOSE, POC 198 (H) 70 - 100 mg/dl   POC BLOOD GLUCOSE (RESULTS)   Result Value Ref Range    GLUCOSE, POC 225 (H) 70 - 100 mg/dl   VANCOMYCIN, TROUGH   Result Value Ref Range    VANCOMYCIN TROUGH 8.9 5.0 - 10.0 ug/mL   BASIC METABOLIC PANEL   Result Value Ref Range    SODIUM 154 (H) 136 - 145 mmol/L    POTASSIUM 4.2 3.5 - 5.1 mmol/L    CHLORIDE 120 (H) 98 - 107 mmol/L    CO2  TOTAL 14 (L) 21 - 31 mmol/L    ANION GAP 20 (H) 4 - 13 mmol/L    CALCIUM 9.0 8.6 - 10.3 mg/dL    GLUCOSE 161 (H) 74 - 109 mg/dL    BUN 65 (H) 7 - 25 mg/dL    CREATININE 0.96 (H) 0.60 - 1.30 mg/dL    BUN/CREA RATIO 19 6 - 22    ESTIMATED GFR 12 (L) >59 mL/min/1.85m^2    OSMOLALITY, CALCULATED 330 (H) 270 - 290 mOsm/kg   CBC   Result Value Ref Range    WBC 16.0 (H) 3.8 - 11.8 x10^3/uL    RBC 2.68 (L) 3.63 - 4.92 x10^6/uL    HGB 7.8 (L) 10.9 - 14.3 g/dL    HCT 04.5 (L) 40.9 - 41.9 %    MCV 90.7 75.5 - 95.3 fL    MCH 29.0 24.7 - 32.8 pg    MCHC 31.9 (L) 32.3 - 35.6 g/dL    RDW 81.1 (H) 91.4 - 17.7 %    PLATELETS 94 (L) 140 - 440 x10^3/uL    MPV 10.1 7.9 - 10.8 fL   MAGNESIUM - AM ONCE   Result Value Ref Range    MAGNESIUM 1.8 (L) 1.9 - 2.7 mg/dL   PHOSPHORUS - AM ONCE   Result Value Ref Range    PHOSPHORUS 2.9 (L) 3.7 - 7.2 mg/dL   MANUAL DIFFERENTIAL   Result Value Ref Range    WBC 16.0 x10^3/uL    NEUTROPHIL % 48 40 - 76 %    LYMPHOCYTE % 11 (L) 25 - 45 %    MONOCYTE % 1 0 - 12 %    EOSINOPHIL %      BASOPHIL %      METAMYELOCYTE % 4 %    MYELOCYTE %      PROMYELOCYTE %      BAND % 36 (H) 5 - 11 %    BLAST %      OTHER %      NEUTROPHIL ABSOLUTE 13.44 (H) 1.80 - 8.40 x10^3/uL    LYMPHOCYTE ABSOLUTE 1.76 1.10 - 5.00 x10^3/uL    MONOCYTE ABSOLUTE 0.16 0.00 - 1.30 x10^3/uL    EOSINOPHIL ABSOLUTE      BASOPHIL ABSOLUTE      METAMYELOCYTE ABSOLUTE 0.64 x10^3/uL    MYELOCYTE ABSOLUTE      PROMYELOCYTE ABSOLUTE      BLAST ABSOLUTE      OTHER CELL ABSOLUTE      ANISOCYTOSIS 1+ (10-25%)     POLYCHROMASIA      POIKILOCYTOSIS 2+ (50%)     BASOPHILIC STIPPLING      MICROCYTOSIS      MACROCYTOSIS      ROULEAUX      SCHISTOCYTES 1+ (10-25%)  SPHEROCYTES      TARGET CELLS      TEARDROP CELLS      OVALOCYTE (ELLIPTOCYTE) 1+ (10-25%)     CRENATED RED CELLS      STOMATOCYTES      ACANTHOCYTES (SPUR CELL) 1+ (10-25%)     ECHINOCYTE (BURR CELL)      BLISTER CELLS      RBC AGGLUTINATES      HOWELL JOLLY BODIES      ATYPICAL LYMPHOCYTES       TOXIC GRANULATION      DOHLE BODIES      TOXIC VACUOLIZATION      AUER RODS      BASKET CELLS      HYPERSEGMENTATION      LARGE PLATELETS Present     PLATELET CLUMPS      PLATELET MORPHOLOGY COMMENT Decreased     BANDS NEUTROPHILS MANUAL 36     BAND ABSOLUTE      NEUTROPHILS MANUAL 48     LYMPHOCYTES MANUAL 11     MONOCYTES MANUAL 1     EOSINOPHILS MANUAL      BASOPHILS MANUAL      PROMYELOCYTES MANUAL      MYELOCYTES MANUAL      METAMYELOCYTES MANUAL 4     BLASTS MANUAL      TOTAL CELLS COUNTED [#] IN BLOOD 100     OTHER CELLS MANUAL      NUCLEATED RBC MANUAL      PLASMA CELL %      PLASMA CELL ABSOLUE      PLASMA CELLS MANUAL      HYPOCHROMASIA     POC BLOOD GLUCOSE (RESULTS)   Result Value Ref Range    GLUCOSE, POC 180 (H) 70 - 100 mg/dl   POC BLOOD GLUCOSE (RESULTS)   Result Value Ref Range    GLUCOSE, POC 151 (H) 70 - 100 mg/dl        IMAGING WITHIN LAST 24 HOURS:   CT BRAIN WO IV CONTRAST    Result Date: 09/13/2022  Impression CHRONIC CHANGES.  NO ACUTE FINDINGS. One or more dose reduction techniques were used (e.g., Automated exposure control, adjustment of the mA and/or kV according to patient size, use of iterative reconstruction technique). Radiologist location ID: [id number]     XR AP MOBILE CHEST    Result Date: 08/24/2022  Impression Suspected pneumonia within the right upper and right lower lobes. Increased density in the right hilum. Question hilar mass or nodal enlargement. Recommend a short-term follow-up radiographs to ensure that these findings resolve. If they do not, would consider CT chest. Radiologist location ID: [id number]    CT BRAIN WO IV CONTRAST   Final Result   CHRONIC CHANGES.  NO ACUTE FINDINGS.          One or more dose reduction techniques were used (e.g., Automated exposure control, adjustment of the mA and/or kV according to patient size, use of iterative reconstruction technique).         Radiologist location ID: [id number]         XR AP MOBILE CHEST   Final Result    Suspected pneumonia within the right upper and right lower lobes. Increased density in the right hilum. Question hilar mass or nodal enlargement. Recommend a short-term follow-up radiographs to ensure that these findings resolve. If they do not, would consider CT chest.            Radiologist location ID: [id number]  MICROBIOLOGY WITHIN LAST 24 HOURS:   No results found for any visits on 09/05/2022 (from the past 24 hour(s)).     DISCHARGE MEDICATIONS:     Current Discharge Medication List        CONTINUE these medications - NO CHANGES were made during your visit.        Details   amLODIPine 5 mg Tablet  Commonly known as: NORVASC   5 mg, Oral, 2 TIMES DAILY  Qty: 180 Tablet  Refills: 1     cyanocobalamin 1,000 mcg/mL Solution  Commonly known as: VITAMIN B12   1,000 mcg, Subcutaneous, EVERY 30 DAYS  Qty: 3 mL  Refills: 1             DISCHARGE DISPOSITION:  patient expired.     DISCHARGE INSTRUCTIONS:  No discharge procedures on file.     Copies sent to Care Team         Relationship Specialty Notifications Start End    [facility] L[facility] [place], [place] PCP - General INTERNAL MEDICINE Admissions 03/09/21     Phone: [phone] Fax: [phone]         [address] [place] [id number]          The hospitalist examined patient, reviewed material, and agreed with discharge at this time.   >30 minutes total were spent coordinating discharge day today    Maxine Glenn, FNP-BC  [facility] MEDICINE HOSPITALIST

## 2022-10-15 NOTE — Progress Notes (Signed)
Family and MPOA have decided to make patient comfort measures only except wanting to keep iv fluids at this time. All other invasive treatments discontinued. IV fluids left in place. Prn morphine, prn ativan, and scopolamine patch for secretions in place. Will discontinue HFNC and transition to oxygen via nasal cannula for comfort not for oxygen saturation reading. Further adjustments will be made based off course. See orders for further details.

## 2022-10-15 NOTE — Nurses Notes (Signed)
CORE notified.

## 2022-10-15 DEATH — deceased

## 2022-12-07 ENCOUNTER — Ambulatory Visit (RURAL_HEALTH_CENTER): Payer: Commercial Managed Care - PPO | Admitting: INTERNAL MEDICINE
